# Patient Record
Sex: Male | Born: 1970 | Race: White | Hispanic: No | Marital: Married | Smoking: Never smoker
Health system: Southern US, Community
[De-identification: ages and names within clinical notes are randomized; demographics above are authoritative.]

## PROBLEM LIST (undated history)

## (undated) DIAGNOSIS — T7840XA Allergy, unspecified, initial encounter: Secondary | ICD-10-CM

## (undated) HISTORY — DX: Allergy, unspecified, initial encounter: T78.40XA

---

## 2012-01-28 ENCOUNTER — Telehealth: Payer: Self-pay

## 2012-01-28 NOTE — Telephone Encounter (Signed)
Please pull chart.  Todd Roach 

## 2012-01-28 NOTE — Telephone Encounter (Signed)
PT STATES HE IN NEED OF THE ANG GEL CREAM PLEASE CALL [phone]   [facility] [address]

## 2012-01-28 NOTE — Telephone Encounter (Signed)
Can we fill this?

## 2012-01-29 NOTE — Telephone Encounter (Signed)
Chart is at pa desk.

## 2012-01-30 MED ORDER — TESTOSTERONE 20.25 MG/1.25GM (1.62%) TD GEL: 4.0000 | Freq: Every day | TRANSDERMAL | Status: DC

## 2012-01-30 NOTE — Telephone Encounter (Signed)
Please advise patient that rx is ready (he may pick up or you may fax), however, he needs OV/labs for additional fills.

## 2012-01-30 NOTE — Telephone Encounter (Signed)
Called pt LMOM to CB. 

## 2012-01-31 ENCOUNTER — Telehealth: Payer: Self-pay

## 2012-01-31 MED ORDER — TESTOSTERONE 30 MG/ACT TD SOLN: 2.0000 | TRANSDERMAL | Status: DC

## 2012-01-31 NOTE — Telephone Encounter (Signed)
Changed to Axiron.

## 2012-01-31 NOTE — Telephone Encounter (Signed)
Faxed RX to [facility] on [address]

## 2012-01-31 NOTE — Telephone Encounter (Signed)
Faxed RX to pharmacy.  

## 2012-01-31 NOTE — Telephone Encounter (Signed)
Todd Roach pharmacy called and it looks like pt insurance will not pay for the androgel anymore. They will cover Androderm, Axiron, or Fortesta. Can we change to one of these?

## 2012-02-01 ENCOUNTER — Telehealth: Payer: Self-pay

## 2012-02-01 NOTE — Telephone Encounter (Signed)
RETURNING CALL FROM CLINICAL TL.  SHE IS CALLING FOR HER HUSBAND BUD.  NAME IS Nicolasa Ducking AT [phone]. NEEDS SOME INFORMATION FOR HER INSURANCE COMPANY RE: BUD'S RX FOR ANDROGEL.

## 2012-02-02 NOTE — Telephone Encounter (Signed)
LMOM to call back

## 2012-02-02 NOTE — Telephone Encounter (Signed)
I tried calling pt several times over the weekend. I spoke with his pharmacist and she stated HIS INSURANCE WILL NOT PAY for androgel but will pay for Androderm so I sent in a prescription for it on Sat. Please inform wife if she calls back.

## 2012-02-03 NOTE — Telephone Encounter (Signed)
LMOM to call back

## 2012-02-03 NOTE — Telephone Encounter (Signed)
Sent patient an unable to reach letter.

## 2012-03-03 ENCOUNTER — Encounter: Payer: Self-pay | Admitting: Family Medicine

## 2012-03-03 ENCOUNTER — Ambulatory Visit (INDEPENDENT_AMBULATORY_CARE_PROVIDER_SITE_OTHER): Payer: 59 | Admitting: Family Medicine

## 2012-03-03 VITALS — BP 130/80 | HR 91 | Temp 98.1°F | Resp 18 | Ht 69.5 in | Wt 347.8 lb

## 2012-03-03 DIAGNOSIS — Q870 Congenital malformation syndromes predominantly affecting facial appearance: Secondary | ICD-10-CM | POA: Insufficient documentation

## 2012-03-03 DIAGNOSIS — E291 Testicular hypofunction: Secondary | ICD-10-CM | POA: Insufficient documentation

## 2012-03-03 DIAGNOSIS — G527 Disorders of multiple cranial nerves: Secondary | ICD-10-CM

## 2012-03-03 DIAGNOSIS — E669 Obesity, unspecified: Secondary | ICD-10-CM

## 2012-03-03 LAB — COMPREHENSIVE METABOLIC PANEL
ALT: 45 U/L (ref 0–53)
AST: 35 U/L (ref 0–37)
Albumin: 4.3 g/dL (ref 3.5–5.2)
Alkaline Phosphatase: 49 U/L (ref 39–117)
BUN: 15 mg/dL (ref 6–23)
CO2: 28 mEq/L (ref 19–32)
Calcium: 9 mg/dL (ref 8.4–10.5)
Chloride: 103 mEq/L (ref 96–112)
Creat: 0.75 mg/dL (ref 0.50–1.35)
Glucose, Bld: 98 mg/dL (ref 70–99)
Potassium: 4.4 mEq/L (ref 3.5–5.3)
Sodium: 139 mEq/L (ref 135–145)
Total Bilirubin: 0.6 mg/dL (ref 0.3–1.2)
Total Protein: 6.9 g/dL (ref 6.0–8.3)

## 2012-03-03 LAB — CBC
HCT: 43.8 % (ref 39.0–52.0)
Hemoglobin: 14.8 g/dL (ref 13.0–17.0)
MCH: 29.5 pg (ref 26.0–34.0)
MCHC: 33.8 g/dL (ref 30.0–36.0)
MCV: 87.3 fL (ref 78.0–100.0)
Platelets: 150 10*3/uL (ref 150–400)
RBC: 5.02 MIL/uL (ref 4.22–5.81)
RDW: 14.1 % (ref 11.5–15.5)
WBC: 5.3 10*3/uL (ref 4.0–10.5)

## 2012-03-03 LAB — TSH: TSH: 2.211 u[IU]/mL (ref 0.350–4.500)

## 2012-03-03 LAB — LIPID PANEL
Cholesterol: 189 mg/dL (ref 0–200)
HDL: 34 mg/dL — ABNORMAL LOW (ref >39)
LDL Cholesterol: 116 mg/dL — ABNORMAL HIGH (ref 0–99)
Total CHOL/HDL Ratio: 5.6 Ratio
Triglycerides: 193 mg/dL — ABNORMAL HIGH (ref <150)
VLDL: 39 mg/dL (ref 0–40)

## 2012-03-03 LAB — TESTOSTERONE: Testosterone: 374.1 ng/dL (ref 300–890)

## 2012-03-03 LAB — PSA: PSA: 0.74 ng/mL (ref <=4.00)

## 2012-03-03 MED ORDER — TESTOSTERONE 30 MG/ACT TD SOLN: 2.0000 | TRANSDERMAL | Status: DC

## 2012-03-03 NOTE — Progress Notes (Signed)
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 He comes in today for check on his hypogonadism. He says he is doing much better with the current dose of Axiron.  Patient is embarking on a weight loss program with his wife. He is walking almost every day now and has started Weight Watchers.  Objective: Cheerful gentleman in no distress, obese  Dentition shows marked plaque buildup. Oropharynx otherwise clear  Neck: Supple, no adenopathy or thyromegaly  Chest: Clear  Heart: Regular no murmur  Extremities: No edema  Assessment: Stable regarding hypogonadism, needs to lose weight.  Plan: Continue Weight Watchers, continue axiron, check PSA, CBC, metabolic parameters, and testosterone  If all labs normal, follow up in 6 months  1. Obesity  CBC, Comprehensive metabolic panel, Lipid panel, TSH  2. Hypogonadism male  CBC, Testosterone 30 MG/ACT SOLN, PSA, Testosterone  3. Moebius syndrome    4. Obesity (BMI 35.0-39.9 without comorbidity)

## 2012-03-04 ENCOUNTER — Encounter: Payer: Self-pay | Admitting: Family Medicine

## 2012-04-26 ENCOUNTER — Other Ambulatory Visit: Payer: Self-pay | Admitting: Family Medicine

## 2012-04-28 ENCOUNTER — Telehealth: Payer: Self-pay

## 2012-04-28 MED ORDER — TESTOSTERONE 30 MG/ACT TD SOLN: 2.0000 | Freq: Every morning | TRANSDERMAL | Status: DC

## 2012-04-28 NOTE — Telephone Encounter (Signed)
PATIENT'S WIFE SAYS THE PHARMACY HAS SENT OVER A REQUEST FOR A REFILL ON AXIRON AND THEY HAVE NOT HEARD ANYTHING.  HE SEES DR. L, BUT HE IS REALLY NEEDING HIS MEDICINE.  PLEASE CALL [phone]

## 2012-04-28 NOTE — Telephone Encounter (Signed)
PUT IN BY ACCIDENT

## 2012-04-28 NOTE — Telephone Encounter (Signed)
Rx done and sent to pharmcy

## 2012-04-29 ENCOUNTER — Telehealth: Payer: Self-pay | Admitting: Family Medicine

## 2012-04-29 NOTE — Telephone Encounter (Signed)
Spoke with wife let her know RX was ready to pick up

## 2012-08-01 ENCOUNTER — Other Ambulatory Visit: Payer: Self-pay | Admitting: Physician Assistant

## 2012-08-01 NOTE — Telephone Encounter (Signed)
Pt needs office visit labs per May 2013 note

## 2012-09-16 ENCOUNTER — Ambulatory Visit (INDEPENDENT_AMBULATORY_CARE_PROVIDER_SITE_OTHER): Payer: 59 | Admitting: Family Medicine

## 2012-09-16 ENCOUNTER — Encounter: Payer: Self-pay | Admitting: Family Medicine

## 2012-09-16 VITALS — BP 132/90 | HR 93 | Temp 98.3°F | Resp 18 | Ht 69.5 in | Wt 347.0 lb

## 2012-09-16 DIAGNOSIS — E291 Testicular hypofunction: Secondary | ICD-10-CM

## 2012-09-16 DIAGNOSIS — Z23 Encounter for immunization: Secondary | ICD-10-CM

## 2012-09-16 LAB — CBC
HCT: 47.1 % (ref 39.0–52.0)
Hemoglobin: 16.6 g/dL (ref 13.0–17.0)
MCH: 30.7 pg (ref 26.0–34.0)
MCHC: 35.2 g/dL (ref 30.0–36.0)
MCV: 87.1 fL (ref 78.0–100.0)
Platelets: 127 10*3/uL — ABNORMAL LOW (ref 150–400)
RBC: 5.41 MIL/uL (ref 4.22–5.81)
RDW: 13.5 % (ref 11.5–15.5)
WBC: 5 10*3/uL (ref 4.0–10.5)

## 2012-09-16 LAB — COMPREHENSIVE METABOLIC PANEL
ALT: 42 U/L (ref 0–53)
AST: 29 U/L (ref 0–37)
Albumin: 4.5 g/dL (ref 3.5–5.2)
Alkaline Phosphatase: 53 U/L (ref 39–117)
BUN: 8 mg/dL (ref 6–23)
CO2: 24 mEq/L (ref 19–32)
Calcium: 8.7 mg/dL (ref 8.4–10.5)
Chloride: 104 mEq/L (ref 96–112)
Creat: 0.76 mg/dL (ref 0.50–1.35)
Glucose, Bld: 91 mg/dL (ref 70–99)
Potassium: 3.9 mEq/L (ref 3.5–5.3)
Sodium: 140 mEq/L (ref 135–145)
Total Bilirubin: 0.5 mg/dL (ref 0.3–1.2)
Total Protein: 7.3 g/dL (ref 6.0–8.3)

## 2012-09-16 LAB — LIPID PANEL
Cholesterol: 176 mg/dL (ref 0–200)
HDL: 36 mg/dL — ABNORMAL LOW (ref >39)
LDL Cholesterol: 107 mg/dL — ABNORMAL HIGH (ref 0–99)
Total CHOL/HDL Ratio: 4.9 Ratio
Triglycerides: 166 mg/dL — ABNORMAL HIGH (ref <150)
VLDL: 33 mg/dL (ref 0–40)

## 2012-09-16 LAB — TESTOSTERONE: Testosterone: 548.34 ng/dL (ref 300–890)

## 2012-09-16 LAB — TESTOSTERONE, FREE

## 2012-09-16 LAB — PSA: PSA: 0.56 ng/mL (ref <=4.00)

## 2012-09-16 NOTE — Progress Notes (Signed)
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 He's having no new symptoms referable to the testosterone. He's been using a daily and does feel like he has more libido and energy.  Objective: No acute distress, patient has gained significant weight Chest is clear Heart: Regular no murmur or gallop Abdomen: Soft nontender with no HSM  Assessment: Stable on current dose of Axiron Plan: Check labs today 1. Hypogonadism male  Testosterone, Testosterone, free, PSA, CBC, Lipid panel, Comprehensive metabolic panel

## 2012-09-19 LAB — TESTOSTERONE, FREE, TOTAL, SHBG
Sex Hormone Binding: 14 nmol/L (ref 13–71)
Testosterone, Free: 169.9 pg/mL (ref 47.0–244.0)
Testosterone-% Free: 3.1 % — ABNORMAL HIGH (ref 1.6–2.9)
Testosterone: 548.34 ng/dL (ref 300–890)

## 2012-10-01 ENCOUNTER — Other Ambulatory Visit: Payer: Self-pay | Admitting: Physician Assistant

## 2013-06-08 ENCOUNTER — Encounter: Payer: Self-pay | Admitting: Family Medicine

## 2013-06-08 ENCOUNTER — Ambulatory Visit (INDEPENDENT_AMBULATORY_CARE_PROVIDER_SITE_OTHER): Payer: 59 | Admitting: Family Medicine

## 2013-06-08 VITALS — BP 118/76 | HR 85 | Temp 97.9°F | Resp 18 | Ht 69.0 in | Wt 345.0 lb

## 2013-06-08 DIAGNOSIS — E291 Testicular hypofunction: Secondary | ICD-10-CM

## 2013-06-08 DIAGNOSIS — J309 Allergic rhinitis, unspecified: Secondary | ICD-10-CM

## 2013-06-08 NOTE — Progress Notes (Signed)
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 He's having no new symptoms referable to the testosterone. He stopped taking the testosterone because he saw adds on TV that suggested he might have a side effect.  Patient continues to have chronic sinus congestion. 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  Objective: No acute distress, obese Chest: Clear Heart: Regular Abdomen: Soft nontender without HSM HEENT: No facial movements, exophoria of the left eye Skin: Unremarkable   Allergic rhinitis - Plan: Ambulatory referral to Allergy, Comprehensive metabolic panel, CBC with Differential  Hypogonadism male - Plan: PSA, Testosterone, CANCELED: Comprehensive metabolic panel, CANCELED: PSA, CANCELED: CBC, CANCELED: Testosterone Encouraged to begin weight loss program including early cessation of bleeding in the evening, regular exercise.  Signed, [name], MD

## 2013-06-22 ENCOUNTER — Other Ambulatory Visit (INDEPENDENT_AMBULATORY_CARE_PROVIDER_SITE_OTHER): Payer: 59 | Admitting: *Deleted

## 2013-06-22 DIAGNOSIS — J309 Allergic rhinitis, unspecified: Secondary | ICD-10-CM

## 2013-06-22 DIAGNOSIS — E291 Testicular hypofunction: Secondary | ICD-10-CM

## 2013-06-22 LAB — COMPREHENSIVE METABOLIC PANEL
ALT: 56 U/L — ABNORMAL HIGH (ref 0–53)
AST: 29 U/L (ref 0–37)
Albumin: 4.4 g/dL (ref 3.5–5.2)
Alkaline Phosphatase: 67 U/L (ref 39–117)
BUN: 14 mg/dL (ref 6–23)
CO2: 29 mEq/L (ref 19–32)
Calcium: 9.2 mg/dL (ref 8.4–10.5)
Chloride: 101 mEq/L (ref 96–112)
Creat: 0.73 mg/dL (ref 0.50–1.35)
Glucose, Bld: 99 mg/dL (ref 70–99)
Potassium: 4.2 mEq/L (ref 3.5–5.3)
Sodium: 139 mEq/L (ref 135–145)
Total Bilirubin: 0.7 mg/dL (ref 0.3–1.2)
Total Protein: 7.2 g/dL (ref 6.0–8.3)

## 2013-06-22 LAB — PSA: PSA: 0.5 ng/mL (ref <=4.00)

## 2013-06-22 LAB — CBC WITH DIFFERENTIAL/PLATELET
Basophils Absolute: 0 10*3/uL (ref 0.0–0.1)
Basophils Relative: 0 % (ref 0–1)
Eosinophils Absolute: 0.1 10*3/uL (ref 0.0–0.7)
Eosinophils Relative: 2 % (ref 0–5)
HCT: 42.6 % (ref 39.0–52.0)
Hemoglobin: 14.7 g/dL (ref 13.0–17.0)
Lymphocytes Relative: 36 % (ref 12–46)
Lymphs Abs: 2.1 10*3/uL (ref 0.7–4.0)
MCH: 30.4 pg (ref 26.0–34.0)
MCHC: 34.5 g/dL (ref 30.0–36.0)
MCV: 88 fL (ref 78.0–100.0)
Monocytes Absolute: 0.6 10*3/uL (ref 0.1–1.0)
Monocytes Relative: 10 % (ref 3–12)
Neutro Abs: 3 10*3/uL (ref 1.7–7.7)
Neutrophils Relative %: 52 % (ref 43–77)
Platelets: 159 10*3/uL (ref 150–400)
RBC: 4.84 MIL/uL (ref 4.22–5.81)
RDW: 13.3 % (ref 11.5–15.5)
WBC: 5.8 10*3/uL (ref 4.0–10.5)

## 2013-06-22 NOTE — Progress Notes (Signed)
Patient here for labs only. 

## 2013-06-23 LAB — TESTOSTERONE: Testosterone: 133 ng/dL — ABNORMAL LOW (ref 300–890)

## 2014-01-18 ENCOUNTER — Ambulatory Visit (INDEPENDENT_AMBULATORY_CARE_PROVIDER_SITE_OTHER): Payer: No Typology Code available for payment source | Admitting: Family Medicine

## 2014-01-18 ENCOUNTER — Encounter: Payer: Self-pay | Admitting: Family Medicine

## 2014-01-18 ENCOUNTER — Other Ambulatory Visit: Payer: Self-pay | Admitting: Family Medicine

## 2014-01-18 VITALS — BP 122/68 | HR 84 | Temp 98.7°F | Resp 16 | Ht 69.0 in | Wt 356.8 lb

## 2014-01-18 DIAGNOSIS — R7309 Other abnormal glucose: Secondary | ICD-10-CM

## 2014-01-18 DIAGNOSIS — E291 Testicular hypofunction: Secondary | ICD-10-CM

## 2014-01-18 DIAGNOSIS — M779 Enthesopathy, unspecified: Secondary | ICD-10-CM

## 2014-01-18 DIAGNOSIS — G4700 Insomnia, unspecified: Secondary | ICD-10-CM

## 2014-01-18 LAB — COMPREHENSIVE METABOLIC PANEL
ALT: 52 U/L (ref 0–53)
AST: 30 U/L (ref 0–37)
Albumin: 4.3 g/dL (ref 3.5–5.2)
Alkaline Phosphatase: 59 U/L (ref 39–117)
BUN: 12 mg/dL (ref 6–23)
CO2: 29 mEq/L (ref 19–32)
Calcium: 9.3 mg/dL (ref 8.4–10.5)
Chloride: 101 mEq/L (ref 96–112)
Creat: 0.73 mg/dL (ref 0.50–1.35)
Glucose, Bld: 111 mg/dL — ABNORMAL HIGH (ref 70–99)
Potassium: 4.5 mEq/L (ref 3.5–5.3)
Sodium: 140 mEq/L (ref 135–145)
Total Bilirubin: 0.6 mg/dL (ref 0.2–1.2)
Total Protein: 7.1 g/dL (ref 6.0–8.3)

## 2014-01-18 LAB — TESTOSTERONE: Testosterone: 99 ng/dL — ABNORMAL LOW (ref 300–890)

## 2014-01-18 LAB — POCT GLYCOSYLATED HEMOGLOBIN (HGB A1C): Hemoglobin A1C: 5.1

## 2014-01-18 MED ORDER — LORAZEPAM 0.5 MG PO TABS: ORAL_TABLET | ORAL | Status: DC

## 2014-01-18 MED ORDER — MELOXICAM 7.5 MG PO TABS: 7.5000 mg | ORAL_TABLET | Freq: Every day | ORAL | Status: DC

## 2014-01-18 NOTE — Progress Notes (Signed)
Subjective:  This chart was scribed for Todd Sidle, MD by Carl Best, Medical Scribe. This patient was seen in Room 22 and the patient's care was started at 9:09 AM.   Patient ID: Todd Roach, male    DOB: 09/15/1971, 43 y.o.   MRN: [id number]  HPI HPI Comments: Todd Roach is a 43 y.o. male who presents to the [facility] complaining of weight gain and depression.  The patient's wife states that the patient has been taking St. John's Wort for a week and a half and has seen an improvement in his Roach.  The patient states that he is hesitant to take an SSRI because he is scared that it would hamper his creativity.  His wife states that he is also experiencing associated insomnia.  The patient is also complaining of seasonal allergies.  The patient's wife states that he is unable to close his eyes completely because of his Moebius syndrome which will cause him to experience allergic reactions.  She states that he takes Zyrtec and Allegra for his symptoms.  He is also complaining of intermittent anterior right hand pain when he extends his arm.  He is lists numbness as an associated symptom.  He states that he types a lot.    The patient is also complaining of large, cystic bumps on his testicles.     Past Medical History  Diagnosis Date   Allergy    No past surgical history on file. Family History  Problem Relation Age of Onset   Diabetes Father    History   Social History   Marital Status: Married    Spouse Name: N/A    Number of Children: N/A   Years of Education: N/A   Freight forwarder    Social History Main Topics   Smoking status: Never Smoker    Smokeless tobacco: Not on file   Alcohol Use: Yes   Drug Use: No   Sexual Activity: Not on file   Other Topics Concern   Not on file   Social History Narrative   No narrative on file   No Known Allergies  Review of Systems  Constitutional: Positive for unexpected  weight change (weight gain).  Psychiatric/Behavioral: Positive for sleep disturbance.  All other systems reviewed and are negative.   Objective:  Physical Exam  Nursing note and vitals reviewed. Constitutional: He is oriented to person, place, and time. He appears well-developed and well-nourished.  HENT:  Head: Normocephalic and atraumatic.  Eyes: EOM are normal.  Neck: Normal range of motion.  Cardiovascular: Normal rate.   Pulmonary/Chest: Effort normal.  Musculoskeletal: Normal range of motion.  Neurological: He is alert and oriented to person, place, and time.  Skin: Skin is warm and dry.  Psychiatric: He has a normal Roach and affect. His behavior is normal.   Examination of the genitalia reveals a few small sebaceous cyst on the scrotum  Results for orders placed in visit on 01/18/14  POCT GLYCOSYLATED HEMOGLOBIN (HGB A1C)      Result Value Ref Range   Hemoglobin A1C 5.1       BP 122/68   Pulse 84   Temp(Src) 98.7 F (37.1 C) (Oral)   Resp 16   Ht 5\' 9"  (1.753 m)   Wt 356 lb 12.8 oz (161.843 kg)   BMI 52.67 kg/m2   SpO2 97% Assessment & Plan:    I personally performed the services described in this documentation, which was scribed  in my presence. The recorded information has been reviewed and is accurate.  Hypogonadism male - Plan: Testosterone, Comprehensive metabolic panel  Tendonitis - Plan: meloxicam (MOBIC) 7.5 MG tablet  Insomnia - Plan: LORazepam (ATIVAN) 0.5 MG tablet  Other abnormal glucose - Plan: POCT glycosylated hemoglobin (Hb A1C)  Signed, Todd SidleKurt Lauenstein, MD

## 2014-01-18 NOTE — Patient Instructions (Signed)

## 2014-02-26 ENCOUNTER — Ambulatory Visit (INDEPENDENT_AMBULATORY_CARE_PROVIDER_SITE_OTHER): Payer: No Typology Code available for payment source | Admitting: Family Medicine

## 2014-02-26 ENCOUNTER — Encounter: Payer: Self-pay | Admitting: Family Medicine

## 2014-02-26 DIAGNOSIS — E291 Testicular hypofunction: Secondary | ICD-10-CM

## 2014-02-26 NOTE — Progress Notes (Signed)
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  Saw endocrinologist who has started testosterone 100  Mg IM q2 weeks.  He felt better 24 hours after first shot.  Objective:  NAD Wt Readings from Last 3 Encounters:  02/26/14 353 lb (160.12 kg)  01/18/14 356 lb 12.8 oz (161.843 kg)  06/08/13 345 lb (156.491 kg)  chest: cleaar Heart:  Reg Ext: no edema  We discussed weight, diet, extercise  Plan:  CPM Recheck one month

## 2014-03-01 ENCOUNTER — Other Ambulatory Visit: Payer: Self-pay | Admitting: Family Medicine

## 2014-03-02 NOTE — Telephone Encounter (Signed)
Dr L, do you want to give pt RFs for this?

## 2014-03-22 ENCOUNTER — Ambulatory Visit: Payer: No Typology Code available for payment source | Admitting: Family Medicine

## 2014-04-16 DIAGNOSIS — E291 Testicular hypofunction: Secondary | ICD-10-CM

## 2014-06-14 ENCOUNTER — Encounter: Payer: No Typology Code available for payment source | Admitting: Family Medicine

## 2014-07-16 ENCOUNTER — Encounter: Payer: No Typology Code available for payment source | Admitting: Family Medicine

## 2014-08-12 ENCOUNTER — Ambulatory Visit (INDEPENDENT_AMBULATORY_CARE_PROVIDER_SITE_OTHER): Payer: No Typology Code available for payment source | Admitting: Emergency Medicine

## 2014-08-12 VITALS — BP 138/90 | HR 90 | Temp 97.9°F | Resp 18 | Ht 73.5 in | Wt 364.0 lb

## 2014-08-12 DIAGNOSIS — L723 Sebaceous cyst: Secondary | ICD-10-CM

## 2014-08-12 MED ORDER — DOXYCYCLINE HYCLATE 100 MG PO TABS: 100.0000 mg | ORAL_TABLET | Freq: Two times a day (BID) | ORAL | Status: DC

## 2014-08-12 NOTE — Progress Notes (Signed)
Verbal Consent Obtained. Local anesthesia with 3 cc of 2% lidocaine plain.  11 blade used to trim necrotic tissue from the wound edges.  No purulence expressed. Packed with 1/4 inch plain packing.  Cleansed and dressed with gauze and paper tape.

## 2014-08-12 NOTE — Progress Notes (Signed)
   Subjective:    Patient ID: Todd Roach, male    DOB: 08/22/1971, 43 y.o.   MRN: [id number]  HPI Chief Complaint  Patient presents with  . Cyst    On patient's back. Ruptured last night. Slight bleeding this morning.    This chart was scribed for Lesle ChrisSteven Meagan Ancona, MD by Andrew Auaven Small, ED Scribe. This patient was seen in room 7 and the patient's care was started at 8:25 AM.  HPI Comments: Todd Roach is a 43 y.o. male who presents to the [facility] complaining of a recurrent cyst located on pt's back. Pt reports  similar cyst in 2012 which was removed by Dr. Milus GlazierLauenstein. Pt reports current cyst spontaneously ruptured last night consisting of blood and pus. Pt denies having a fever but reports he has been feeling off. After rupture pt treat cyst with Bactroban.    Past Medical History  Diagnosis Date  . Allergy    No Known Allergies Prior to Admission medications   Medication Sig Start Date End Date Taking? Authorizing Provider  cetirizine (ZYRTEC) 10 MG tablet Take 10 mg by mouth daily.   Yes Historical Provider, MD  Fexofenadine HCl (ALLEGRA PO) Take by mouth.   Yes Historical Provider, MD  LORazepam (ATIVAN) 0.5 MG tablet Prn hs 01/18/14  Yes Elvina SidleKurt Lauenstein, MD  meloxicam (MOBIC) 7.5 MG tablet TAKE 1 TABLET BY MOUTH ONCE DAILY   Yes Elvina SidleKurt Lauenstein, MD  Multiple Vitamin (MULTIVITAMIN) tablet Take 1 tablet by mouth daily.   Yes Historical Provider, MD  Omega-3 Fatty Acids (FISH OIL PO) Take by mouth daily.   Yes Historical Provider, MD  Testosterone 30 MG/ACT SOLN Place 2 Act onto the skin every morning. 03/03/12  Yes Elvina SidleKurt Lauenstein, MD  Testosterone 30 MG/ACT SOLN Place 2 Act onto the skin every morning. 04/28/12  Yes Heather M Marte, PA-C  [facility] J[facility] WORT PO Take by mouth daily.    Historical Provider, MD   Review of Systems  Constitutional: Negative for fever and chills.    Objective:   Physical Exam CONSTITUTIONAL: Well developed/well nourished HEAD:  Normocephalic/atraumatic EYES: EOMI/PERRL ENMT: Mucous membranes moist NECK: supple no meningeal signs SPINE:entire spine nontender CV: S1/S2 noted, no murmurs/rubs/gallops noted LUNGS: Lungs are clear to auscultation bilaterally, no apparent distress ABDOMEN: soft, nontender, no rebound or guarding GU:no cva tenderness NEURO: Pt is awake/alert, moves all extremitiesx4 EXTREMITIES: pulses normal, full ROM SKIN: warm, color normal,  Back- 1x3 cm a raised neurotic area above the right shoulder. Purulent sebaceous discharge present. There is an odor to discharge.  PSYCH: no abnormalities of mood noted       Assessment & Plan:  Patient has an infected sebaceous cyst on right upper back. This area will be drained. He will be on doxycycline twice a day. Recheck in 24-48 hours for packing change.I personally performed the services described in this documentation, which was scribed in my presence. The recorded information has been reviewed and is accurate.

## 2014-08-12 NOTE — Patient Instructions (Signed)
Take the antibiotic as prescribed. Apply a warm compress to the area for 15-20 minutes 2-4 times each day. Change the dressing if it becomes saturated, leaks or falls off.  

## 2014-08-14 ENCOUNTER — Ambulatory Visit (INDEPENDENT_AMBULATORY_CARE_PROVIDER_SITE_OTHER): Payer: No Typology Code available for payment source | Admitting: Physician Assistant

## 2014-08-14 VITALS — BP 144/84 | HR 78 | Temp 97.5°F | Resp 20 | Ht 69.0 in | Wt 359.2 lb

## 2014-08-14 DIAGNOSIS — L723 Sebaceous cyst: Secondary | ICD-10-CM

## 2014-08-15 LAB — WOUND CULTURE
GRAM STAIN: NONE SEEN
Gram Stain: NONE SEEN

## 2014-08-17 ENCOUNTER — Ambulatory Visit (INDEPENDENT_AMBULATORY_CARE_PROVIDER_SITE_OTHER): Payer: No Typology Code available for payment source | Admitting: Physician Assistant

## 2014-08-17 VITALS — BP 124/84 | HR 84 | Temp 98.3°F | Resp 18 | Ht 70.0 in | Wt 358.0 lb

## 2014-08-17 DIAGNOSIS — L723 Sebaceous cyst: Secondary | ICD-10-CM

## 2014-08-17 NOTE — Patient Instructions (Signed)
Return on 11/10 for wound recheck. Have your wife remove an inch of the packing on Sunday. Change your dressings daily

## 2014-08-17 NOTE — Progress Notes (Signed)
   Subjective:    Patient ID: Todd Roach, male    DOB: 07/12/1971, 43 y.o.   MRN: [id number]  HPI  Pt presents to clinic for a recheck.  He feels much better.  The area is draining bloody pus like discharge.  He is tolerating the abx ok.  Review of Systems     Objective:   Physical Exam  Constitutional: He is oriented to person, place, and time. He appears well-developed and well-nourished.  Pulmonary/Chest: Effort normal.  Neurological: He is alert and oriented to person, place, and time.  Skin: Skin is warm and dry.  Drsg and packing removed.  The area around the wound had minimal erythema - most of the erythema is related to drsg reaction.  Purulence expressed as well as sebaceous material.  The wound cavity extended really medially compared the incision so the incision was extended and some cyst wall was able to be removed.  The area was repacked and drsg was placed.   Psychiatric: He has a normal mood and affect. His behavior is normal. Judgment and thought content normal.  Vitals reviewed.      Assessment & Plan:

## 2014-08-17 NOTE — Progress Notes (Signed)
I was directly involved with the patient's care and agree with the physical, diagnosis and treatment plan.  

## 2014-08-17 NOTE — Progress Notes (Signed)
Subjective:    Patient ID: Todd Roach, male    DOB: 08/10/1971, 43 y.o.   MRN: [id number] Patient Active Problem List   Diagnosis Date Noted  . Moebius syndrome 03/03/2012  . Obesity (BMI 35.0-39.9 without comorbidity) 03/03/2012  . Hypogonadism male 03/03/2012   Prior to Admission medications   Medication Sig Start Date End Date Taking? Authorizing Provider  cetirizine (ZYRTEC) 10 MG tablet Take 10 mg by mouth daily.   Yes Historical Provider, MD  doxycycline (VIBRA-TABS) 100 MG tablet Take 1 tablet (100 mg total) by mouth 2 (two) times daily. 08/12/14  Yes Collene GobbleSteven A Daub, MD  Fexofenadine HCl (ALLEGRA PO) Take by mouth.   Yes Historical Provider, MD  LORazepam (ATIVAN) 0.5 MG tablet Prn hs 01/18/14  Yes Elvina SidleKurt Lauenstein, MD  meloxicam (MOBIC) 7.5 MG tablet TAKE 1 TABLET BY MOUTH ONCE DAILY   Yes Elvina SidleKurt Lauenstein, MD  Multiple Vitamin (MULTIVITAMIN) tablet Take 1 tablet by mouth daily.   Yes Historical Provider, MD  Omega-3 Fatty Acids (FISH OIL PO) Take by mouth daily.   Yes Historical Provider, MD  ST JOHNS WORT PO Take by mouth daily.   Yes Historical Provider, MD  testosterone cypionate (DEPOTESTOTERONE CYPIONATE) 200 MG/ML injection Inject 50 mg into the muscle. 06/14/14  Yes Historical Provider, MD   No Known Allergies  HPI  This is a 43 year old male presenting for 2nd recheck of an infected sebaceous cyst on his back. The area had spontaneously drained. The area was debrided and packed on 08/12/14. The opening of the wound was extended on 08/14/14 and was repacked. Today he is reporting he is without pain and feeling much better. He is still on doxycycline and tolerating well. He reports he is changing his dressing at least daily. He denies fever, chills, N/V/D.  Review of Systems  Constitutional: Negative for fever and chills.  Gastrointestinal: Negative for nausea, vomiting and diarrhea.  Skin: Positive for wound.      Objective:   Physical Exam  Constitutional: He is oriented  to person, place, and time. He appears well-developed and well-nourished. No distress.  HENT:  Head: Normocephalic and atraumatic.  Right Ear: Hearing normal.  Left Ear: Hearing normal.  Nose: Nose normal.  Eyes: Conjunctivae and lids are normal. Right eye exhibits no discharge. Left eye exhibits no discharge. No scleral icterus.  Pulmonary/Chest: Effort normal. No respiratory distress.  Neurological: He is alert and oriented to person, place, and time.  Skin: Skin is warm and dry. No rash noted.  Wound on back  Psychiatric: He has a normal mood and affect. His speech is normal and behavior is normal. Thought content normal.   BP 124/84 mmHg  Pulse 84  Temp(Src) 98.3 F (36.8 C) (Oral)  Resp 18  Ht 5\' 10"  (1.778 m)  Wt 358 lb (162.388 kg)  BMI 51.37 kg/m2  SpO2 98%   The dressing and packing were removed. Opening is 0.5 cm healing from a 1-1.5 cm incision with a total of 4-5 cm induration, no fluctuance. No purulence drained. Wound was irrigated with 5 cc 1% lido. Wound was loosely packed with 1/2 inch plain packing. Dressing placed and wound care discussed.    Assessment & Plan:  1. Sebaceous cyst Wound is healing nicely. No additional purulence expressed today. His comfort has improved. He will continue on doxycycline. He will continue to change his dressings daily. He was instructed to have his wife removed an inch of packing in 3 days and re-dress.  He will return for a wound check on 08/21/14.   Roswell MinersNicole V. Dyke BrackettBush, PA-C, MHS [facility]  08/17/2014

## 2014-08-20 ENCOUNTER — Encounter: Payer: No Typology Code available for payment source | Admitting: Family Medicine

## 2014-08-21 ENCOUNTER — Ambulatory Visit (INDEPENDENT_AMBULATORY_CARE_PROVIDER_SITE_OTHER): Payer: No Typology Code available for payment source | Admitting: Physician Assistant

## 2014-08-21 VITALS — BP 144/72 | HR 99 | Temp 99.0°F | Resp 18 | Ht 70.0 in | Wt 358.0 lb

## 2014-08-21 DIAGNOSIS — L723 Sebaceous cyst: Secondary | ICD-10-CM

## 2014-08-21 NOTE — Progress Notes (Signed)
   Subjective:    Patient ID: Todd Roach, male    DOB: 02/13/1971, 43 y.o.   MRN: [id number]  HPI Patient presents day 9 after I&D of sebaceous cyst on back. He is on last day of antibiotics and says has tolerated meds well. Denies pain, fever, N/V at this time.Wife has been changing dressing for him.   Review of Systems  Constitutional: Negative for fever and chills.  Gastrointestinal: Negative for nausea and vomiting.  Skin: Positive for wound. Negative for rash.       Dressing changed by wife       Objective:   Physical Exam  Constitutional: He is oriented to person, place, and time. He appears well-developed and well-nourished. No distress.  Blood pressure 144/72, pulse 99, temperature 99 F (37.2 C), temperature source Oral, resp. rate 18, height 5\' 10"  (1.778 m), weight 358 lb (162.388 kg), SpO2 96 %.   HENT:  Head: Normocephalic and atraumatic.  Right Ear: External ear normal.  Left Ear: External ear normal.  Pulmonary/Chest: Effort normal.  Neurological: He is alert and oriented to person, place, and time.  Skin: Skin is warm and dry. No rash noted. He is not diaphoretic. No erythema. No pallor.  Intact dressing and packing removed. Necrotic tissue at border of wound. Cloudy fluid and blood in wound. Minimal to no erythema. Tissue within wound pink.    Procedure Consent obtained. Wound irrigated with 5 cc 1% lido. Necrotic tissue removed. Cloudy, sanguinous fluid expressed. Wound expressed. Depth of 2 cm medially. Surface wound 1 1/2 cm. 1/4 plain packing placed. Clean dressing placed.      Assessment & Plan:  1. Sebaceous cyst Repacked and dressing placed. Wound healing well. Finish doxycycline. RTC 08/23/14 for re-eval.   Todd Ridgeishira Travone Georg PA-C  [facility] 08/21/2014 4:36 PM

## 2014-08-21 NOTE — Progress Notes (Signed)
I was directly involved with the patient's care and agree with the physical, diagnosis and treatment plan.  

## 2014-08-22 ENCOUNTER — Encounter: Payer: Self-pay | Admitting: Family Medicine

## 2014-08-22 ENCOUNTER — Ambulatory Visit (INDEPENDENT_AMBULATORY_CARE_PROVIDER_SITE_OTHER): Payer: No Typology Code available for payment source | Admitting: Family Medicine

## 2014-08-22 VITALS — BP 102/64 | HR 85 | Temp 98.0°F | Resp 16 | Ht 69.0 in | Wt 356.0 lb

## 2014-08-22 DIAGNOSIS — R202 Paresthesia of skin: Secondary | ICD-10-CM

## 2014-08-22 DIAGNOSIS — Z23 Encounter for immunization: Secondary | ICD-10-CM

## 2014-08-22 DIAGNOSIS — Z0000 Encounter for general adult medical examination without abnormal findings: Secondary | ICD-10-CM

## 2014-08-22 LAB — LIPID PANEL
Cholesterol: 196 mg/dL (ref 0–200)
HDL: 43 mg/dL (ref >39)
LDL Cholesterol: 118 mg/dL — ABNORMAL HIGH (ref 0–99)
Total CHOL/HDL Ratio: 4.6 Ratio
Triglycerides: 175 mg/dL — ABNORMAL HIGH (ref <150)
VLDL: 35 mg/dL (ref 0–40)

## 2014-08-22 LAB — COMPLETE METABOLIC PANEL WITH GFR
ALT: 34 U/L (ref 0–53)
AST: 23 U/L (ref 0–37)
Albumin: 4.4 g/dL (ref 3.5–5.2)
Alkaline Phosphatase: 65 U/L (ref 39–117)
BUN: 20 mg/dL (ref 6–23)
CO2: 29 mEq/L (ref 19–32)
Calcium: 9.7 mg/dL (ref 8.4–10.5)
Chloride: 99 mEq/L (ref 96–112)
Creat: 0.66 mg/dL (ref 0.50–1.35)
GFR, Est African American: >89 mL/min
GFR, Est Non African American: >89 mL/min
Glucose, Bld: 77 mg/dL (ref 70–99)
Potassium: 4.4 mEq/L (ref 3.5–5.3)
Sodium: 137 mEq/L (ref 135–145)
Total Bilirubin: 0.5 mg/dL (ref 0.2–1.2)
Total Protein: 7.5 g/dL (ref 6.0–8.3)

## 2014-08-22 LAB — POCT URINALYSIS DIPSTICK
Bilirubin, UA: NEGATIVE
Blood, UA: NEGATIVE
Glucose, UA: NEGATIVE
Ketones, UA: NEGATIVE
Leukocytes, UA: NEGATIVE
Nitrite, UA: NEGATIVE
Protein, UA: NEGATIVE
Spec Grav, UA: 1.015
Urobilinogen, UA: 0.2
pH, UA: 6.5

## 2014-08-22 LAB — CBC WITH DIFFERENTIAL/PLATELET
Basophils Absolute: 0 10*3/uL (ref 0.0–0.1)
Basophils Relative: 0 % (ref 0–1)
Eosinophils Absolute: 0.1 10*3/uL (ref 0.0–0.7)
Eosinophils Relative: 1 % (ref 0–5)
HCT: 47.4 % (ref 39.0–52.0)
Hemoglobin: 15.8 g/dL (ref 13.0–17.0)
Lymphocytes Relative: 26 % (ref 12–46)
Lymphs Abs: 2.1 10*3/uL (ref 0.7–4.0)
MCH: 29.1 pg (ref 26.0–34.0)
MCHC: 33.3 g/dL (ref 30.0–36.0)
MCV: 87.3 fL (ref 78.0–100.0)
Monocytes Absolute: 0.6 10*3/uL (ref 0.1–1.0)
Monocytes Relative: 7 % (ref 3–12)
Neutro Abs: 5.4 10*3/uL (ref 1.7–7.7)
Neutrophils Relative %: 66 % (ref 43–77)
Platelets: 164 10*3/uL (ref 150–400)
RBC: 5.43 MIL/uL (ref 4.22–5.81)
RDW: 13.3 % (ref 11.5–15.5)
WBC: 8.2 10*3/uL (ref 4.0–10.5)

## 2014-08-22 MED ORDER — DICLOFENAC SODIUM 75 MG PO TBEC: 75.0000 mg | DELAYED_RELEASE_TABLET | Freq: Two times a day (BID) | ORAL | Status: DC

## 2014-08-22 NOTE — Patient Instructions (Signed)
Two areas to focus:  Teeth and weight.   Health Maintenance A healthy lifestyle and preventative care can promote health and wellness.  Maintain regular health, dental, and eye exams.  Eat a healthy diet. Foods like vegetables, fruits, whole grains, low-fat dairy products, and lean protein foods contain the nutrients you need and are low in calories. Decrease your intake of foods high in solid fats, added sugars, and salt. Get information about a proper diet from your health care provider, if necessary.  Regular physical exercise is one of the most important things you can do for your health. Most adults should get at least 150 minutes of moderate-intensity exercise (any activity that increases your heart rate and causes you to sweat) each week. In addition, most adults need muscle-strengthening exercises on 2 or more days a week.   Maintain a healthy weight. The body mass index (BMI) is a screening tool to identify possible weight problems. It provides an estimate of body fat based on height and weight. Your health care provider can find your BMI and can help you achieve or maintain a healthy weight. For males 20 years and older:  A BMI below 18.5 is considered underweight.  A BMI of 18.5 to 24.9 is normal.  A BMI of 25 to 29.9 is considered overweight.  A BMI of 30 and above is considered obese.  Maintain normal blood lipids and cholesterol by exercising and minimizing your intake of saturated fat. Eat a balanced diet with plenty of fruits and vegetables. Blood tests for lipids and cholesterol should begin at age 320 and be repeated every 5 years. If your lipid or cholesterol levels are high, you are over age 43, or you are at high risk for heart disease, you may need your cholesterol levels checked more frequently.Ongoing high lipid and cholesterol levels should be treated with medicines if diet and exercise are not working.  If you smoke, find out from your health care provider how to  quit. If you do not use tobacco, do not start.  Lung cancer screening is recommended for adults aged 55-80 years who are at high risk for developing lung cancer because of a history of smoking. A yearly low-dose CT scan of the lungs is recommended for people who have at least a 30-pack-year history of smoking and are current smokers or have quit within the past 15 years. A pack year of smoking is smoking an average of 1 pack of cigarettes a day for 1 year (for example, a 30-pack-year history of smoking could mean smoking 1 pack a day for 30 years or 2 packs a day for 15 years). Yearly screening should continue until the smoker has stopped smoking for at least 15 years. Yearly screening should be stopped for people who develop a health problem that would prevent them from having lung cancer treatment.  If you choose to drink alcohol, do not have more than 2 drinks per day. One drink is considered to be 12 oz (360 mL) of beer, 5 oz (150 mL) of wine, or 1.5 oz (45 mL) of liquor.  Avoid the use of street drugs. Do not share needles with anyone. Ask for help if you need support or instructions about stopping the use of drugs.  High blood pressure causes heart disease and increases the risk of stroke. Blood pressure should be checked at least every 1-2 years. Ongoing high blood pressure should be treated with medicines if weight loss and exercise are not effective.  If you  are 45-7 years old, ask your health care provider if you should take aspirin to prevent heart disease.  Diabetes screening involves taking a blood sample to check your fasting blood sugar level. This should be done once every 3 years after age 58 if you are at a normal weight and without risk factors for diabetes. Testing should be considered at a younger age or be carried out more frequently if you are overweight and have at least 1 risk factor for diabetes.  Colorectal cancer can be detected and often prevented. Most routine colorectal  cancer screening begins at the age of 97 and continues through age 98. However, your health care provider may recommend screening at an earlier age if you have risk factors for colon cancer. On a yearly basis, your health care provider may provide home test kits to check for hidden blood in the stool. A small camera at the end of a tube may be used to directly examine the colon (sigmoidoscopy or colonoscopy) to detect the earliest forms of colorectal cancer. Talk to your health care provider about this at age 43 when routine screening begins. A direct exam of the colon should be repeated every 5-10 years through age 87, unless early forms of precancerous polyps or small growths are found.  People who are at an increased risk for hepatitis B should be screened for this virus. You are considered at high risk for hepatitis B if:  You were born in a country where hepatitis B occurs often. Talk with your health care provider about which countries are considered high risk.  Your parents were born in a high-risk country and you have not received a shot to protect against hepatitis B (hepatitis B vaccine).  You have HIV or AIDS.  You use needles to inject street drugs.  You live with, or have sex with, someone who has hepatitis B.  You are a man who has sex with other men (MSM).  You get hemodialysis treatment.  You take certain medicines for conditions like cancer, organ transplantation, and autoimmune conditions.  Hepatitis C blood testing is recommended for all people born from [birth year] through 1965 and any individual with known risk factors for hepatitis C.  Healthy men should no longer receive prostate-specific antigen (PSA) blood tests as part of routine cancer screening. Talk to your health care provider about prostate cancer screening.  Testicular cancer screening is not recommended for adolescents or adult males who have no symptoms. Screening includes self-exam, a health care provider exam, and  other screening tests. Consult with your health care provider about any symptoms you have or any concerns you have about testicular cancer.  Practice safe sex. Use condoms and avoid high-risk sexual practices to reduce the spread of sexually transmitted infections (STIs).  You should be screened for STIs, including gonorrhea and chlamydia if:  You are sexually active and are younger than 24 years.  You are older than 24 years, and your health care provider tells you that you are at risk for this type of infection.  Your sexual activity has changed since you were last screened, and you are at an increased risk for chlamydia or gonorrhea. Ask your health care provider if you are at risk.  If you are at risk of being infected with HIV, it is recommended that you take a prescription medicine daily to prevent HIV infection. This is called pre-exposure prophylaxis (PrEP). You are considered at risk if:  You are a man who has sex with  other men (MSM).  You are a heterosexual man who is sexually active with multiple partners.  You take drugs by injection.  You are sexually active with a partner who has HIV.  Talk with your health care provider about whether you are at high risk of being infected with HIV. If you choose to begin PrEP, you should first be tested for HIV. You should then be tested every 3 months for as long as you are taking PrEP.  Use sunscreen. Apply sunscreen liberally and repeatedly throughout the day. You should seek shade when your shadow is shorter than you. Protect yourself by wearing long sleeves, pants, a wide-brimmed hat, and sunglasses year round whenever you are outdoors.  Tell your health care provider of new moles or changes in moles, especially if there is a change in shape or color. Also, tell your health care provider if a mole is larger than the size of a pencil eraser.  A one-time screening for abdominal aortic aneurysm (AAA) and surgical repair of large AAAs by  ultrasound is recommended for men aged 65-75 years who are current or former smokers.  Stay current with your vaccines (immunizations). Document Released: 03/26/2008 Document Revised: 10/03/2013 Document Reviewed: 02/23/2011 [facility] Patient Information 2015 [place], [place]. This information is not intended to replace advice given to you by your health care provider. Make sure you discuss any questions you have with your health care provider.

## 2014-08-22 NOTE — Progress Notes (Addendum)
Patient ID: Todd Roach MRN: [id number], DOB: 04/28/1971 43 y.o. Date of Encounter: 08/22/2014, 2:21 PM  Primary Physician: No PCP Per Patient  Chief Complaint: Physical (CPE)  HPI: 43 y.o. y/o male with history noted below here for CPE.  Doing well. No issues/complaints.  Review of Systems: Consitutional: No fever, chills, fatigue, night sweats, lymphadenopathy, or weight changes. Eyes: No visual changes, eye redness, or discharge. ENT/Mouth: Ears: No otalgia, tinnitus, hearing loss, discharge. Nose: No congestion, rhinorrhea, sinus pain, or epistaxis. Throat: No sore throat, post nasal drip, or teeth pain. Cardiovascular: No CP, palpitations, diaphoresis, DOE, edema, orthopnea, PND. Respiratory: No cough, hemoptysis, SOB, or wheezing. Gastrointestinal: No anorexia, dysphagia, reflux, pain, nausea, vomiting, hematemesis, diarrhea, constipation, BRBPR, or melena. Genitourinary: No dysuria, frequency, urgency, hematuria, incontinence, nocturia, decreased urinary stream, discharge, impotence, or testicular pain/masses. Musculoskeletal: No decreased ROM, myalgias, stiffness, joint swelling, or weakness. Skin: No rash, erythema, lesion changes, pain, warmth, jaundice, or pruritis. Neurological: No headache, dizziness, syncope, seizures, tremors, memory loss, coordination problems, or paresthesias. Psychological: No anxiety, depression, hallucinations, SI/HI. Endocrine: No fatigue, polydipsia, polyphagia, polyuria, or known diabetes. All other systems were reviewed and are otherwise negative.  Past Medical History  Diagnosis Date  . Allergy      No past surgical history on file.  Home Meds:  Prior to Admission medications   Medication Sig Start Date End Date Taking? Authorizing Provider  cetirizine (ZYRTEC) 10 MG tablet Take 10 mg by mouth daily.    Historical Provider, MD  doxycycline (VIBRA-TABS) 100 MG tablet Take 1 tablet (100 mg total) by mouth 2 (two) times daily. 08/12/14    Collene GobbleSteven A Daub, MD  Fexofenadine HCl (ALLEGRA PO) Take by mouth.    Historical Provider, MD  LORazepam (ATIVAN) 0.5 MG tablet Prn hs 01/18/14   Elvina SidleKurt Dayvon Dax, MD  meloxicam (MOBIC) 7.5 MG tablet TAKE 1 TABLET BY MOUTH ONCE DAILY    Elvina SidleKurt Audon Heymann, MD  Multiple Vitamin (MULTIVITAMIN) tablet Take 1 tablet by mouth daily.    Historical Provider, MD  Omega-3 Fatty Acids (FISH OIL PO) Take by mouth daily.    Historical Provider, MD  ST JOHNS WORT PO Take by mouth daily.    Historical Provider, MD  testosterone cypionate (DEPOTESTOTERONE CYPIONATE) 200 MG/ML injection Inject 50 mg into the muscle. 06/14/14   Historical Provider, MD    Allergies: No Known Allergies  History   Social History  . Marital Status: Married    Spouse Name: N/A    Number of Children: N/A  . Years of Education: N/A   Occupational History  . Writer    Social History Main Topics  . Smoking status: Never Smoker   . Smokeless tobacco: Not on file  . Alcohol Use: Yes  . Drug Use: No  . Sexual Activity: Not on file   Other Topics Concern  . Not on file   Social History Narrative    Family History  Problem Relation Age of Onset  . Diabetes Father     Physical Exam: Resp. rate 16, height 5\' 9"  (1.753 m), weight 356 lb (161.481 kg).  BP Readings from Last 3 Encounters:  08/21/14 144/72  08/17/14 124/84  08/14/14 144/84   General: Well developed, well nourished, in no acute distress. HEENT: Normocephalic, atraumatic. Conjunctiva pink, sclera non-icteric. Pupils 2 mm constricting to 1 mm, round, regular, and equally reactive to light and accomodation. EOM shows left exotropia. Internal auditory canal clear. TMs with good cone of light and without pathology. Nasal mucosa  pink. Nares are without discharge. No sinus tenderness. Oral mucosa pink. Dentition poor. Pharynx without exudate.       Neck: Supple. Trachea midline. No thyromegaly. Full ROM. No lymphadenopathy. Lungs: Clear to auscultation bilaterally  without wheezes, rales, or rhonchi. Breathing is of normal effort and unlabored. Cardiovascular: RRR with S1 S2. No murmurs, rubs, or gallops appreciated. Distal pulses 2+ symmetrically. No carotid or abdominal bruits Abdomen: Soft, non-tender, non-distended with normoactive bowel sounds. No hepatosplenomegaly or masses. No rebound/guarding. No CVA tenderness. Without hernias.   Genitourinary:  circumcised male. No penile lesions. Testes descended bilaterally, and smooth without tenderness or masses.  Musculoskeletal: Full range of motion and 5/5 strength throughout. Without swelling, atrophy, tenderness, crepitus, or warmth. Extremities without clubbing, cyanosis, or edema. Calves supple. Skin: Warm and moist without erythema, ecchymosis, wounds, or rash.  His sebaceous cyst is clean and healing nicely on right scapula Neuro: A+Ox3. CN II-XII grossly intact with exception of immobile facial muscles and EOM as noted above. Moves all extremities spontaneously. Full sensation throughout. Normal gait. DTR 2+ throughout upper and lower extremities. Finger to nose intact. Psych:  Responds to questions appropriately with a normal affect.   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  Patient promises to work on these.  Signed, Elvina SidleKurt Khi Mcmillen, MD 08/22/2014 2:21 PM

## 2014-08-23 LAB — HEMOGLOBIN A1C
Hgb A1c MFr Bld: 5.5 % (ref <5.7)
Mean Plasma Glucose: 111 mg/dL (ref <117)

## 2014-08-27 ENCOUNTER — Encounter: Payer: No Typology Code available for payment source | Admitting: Family Medicine

## 2014-09-28 ENCOUNTER — Other Ambulatory Visit: Payer: Self-pay | Admitting: Family Medicine

## 2014-09-28 ENCOUNTER — Telehealth: Payer: Self-pay | Admitting: *Deleted

## 2014-09-28 NOTE — Addendum Note (Signed)
Addended by: Sheppard PlumberBRIGGS, Charvez Voorhies A on: 09/28/2014 12:22 PM   Modules accepted: Orders

## 2014-09-28 NOTE — Telephone Encounter (Signed)
Called pharmacy to cancel ativan prescription that sent by mistake.   Routing prescription to Doctor

## 2014-10-01 ENCOUNTER — Other Ambulatory Visit: Payer: Self-pay | Admitting: Family Medicine

## 2014-10-09 ENCOUNTER — Other Ambulatory Visit: Payer: Self-pay | Admitting: Family Medicine

## 2014-10-09 NOTE — Telephone Encounter (Signed)
I am out of town until next week.  Please refill x 5 months

## 2014-10-10 NOTE — Telephone Encounter (Signed)
Called in RFs per Dr Cain SaupeL's instr's.

## 2014-11-22 ENCOUNTER — Encounter: Payer: Self-pay | Admitting: Family Medicine

## 2014-11-22 DIAGNOSIS — E349 Endocrine disorder, unspecified: Secondary | ICD-10-CM | POA: Insufficient documentation

## 2014-12-10 ENCOUNTER — Other Ambulatory Visit: Payer: Self-pay | Admitting: Physician Assistant

## 2015-07-28 ENCOUNTER — Other Ambulatory Visit: Payer: Self-pay | Admitting: Family Medicine

## 2016-07-21 ENCOUNTER — Ambulatory Visit: Payer: No Typology Code available for payment source | Admitting: Family Medicine

## 2016-07-29 ENCOUNTER — Ambulatory Visit: Payer: No Typology Code available for payment source | Admitting: Family Medicine

## 2016-08-12 ENCOUNTER — Encounter: Payer: Self-pay | Admitting: Family Medicine

## 2016-08-12 ENCOUNTER — Ambulatory Visit (INDEPENDENT_AMBULATORY_CARE_PROVIDER_SITE_OTHER): Payer: BLUE CROSS/BLUE SHIELD | Admitting: Family Medicine

## 2016-08-12 VITALS — BP 122/80 | HR 93 | Temp 97.7°F | Resp 18 | Ht 69.0 in | Wt 370.0 lb

## 2016-08-12 DIAGNOSIS — L723 Sebaceous cyst: Secondary | ICD-10-CM | POA: Diagnosis not present

## 2016-08-12 DIAGNOSIS — Z13220 Encounter for screening for lipoid disorders: Secondary | ICD-10-CM

## 2016-08-12 DIAGNOSIS — Z114 Encounter for screening for human immunodeficiency virus [HIV]: Secondary | ICD-10-CM | POA: Diagnosis not present

## 2016-08-12 DIAGNOSIS — Z131 Encounter for screening for diabetes mellitus: Secondary | ICD-10-CM

## 2016-08-12 DIAGNOSIS — Q870 Congenital malformation syndromes predominantly affecting facial appearance: Secondary | ICD-10-CM

## 2016-08-12 DIAGNOSIS — E291 Testicular hypofunction: Secondary | ICD-10-CM | POA: Diagnosis not present

## 2016-08-12 DIAGNOSIS — L0591 Pilonidal cyst without abscess: Secondary | ICD-10-CM

## 2016-08-12 DIAGNOSIS — Z23 Encounter for immunization: Secondary | ICD-10-CM

## 2016-08-12 LAB — COMPREHENSIVE METABOLIC PANEL
ALBUMIN: 4.6 g/dL (ref 3.6–5.1)
ALK PHOS: 57 U/L (ref 40–115)
ALT: 63 U/L — AB (ref 9–46)
AST: 39 U/L (ref 10–40)
BILIRUBIN TOTAL: 0.5 mg/dL (ref 0.2–1.2)
BUN: 11 mg/dL (ref 7–25)
CALCIUM: 9.2 mg/dL (ref 8.6–10.3)
CO2: 27 mmol/L (ref 20–31)
Chloride: 102 mmol/L (ref 98–110)
Creat: 0.88 mg/dL (ref 0.60–1.35)
Glucose, Bld: 87 mg/dL (ref 65–99)
POTASSIUM: 4.2 mmol/L (ref 3.5–5.3)
Sodium: 140 mmol/L (ref 135–146)
TOTAL PROTEIN: 7.7 g/dL (ref 6.1–8.1)

## 2016-08-12 LAB — CBC WITH DIFFERENTIAL/PLATELET
Basophils Absolute: 0 cells/uL (ref 0–200)
Basophils Relative: 0 %
EOS ABS: 56 {cells}/uL (ref 15–500)
Eosinophils Relative: 1 %
HEMATOCRIT: 44.4 % (ref 38.5–50.0)
HEMOGLOBIN: 14.7 g/dL (ref 13.2–17.1)
LYMPHS ABS: 1904 {cells}/uL (ref 850–3900)
LYMPHS PCT: 34 %
MCH: 29.8 pg (ref 27.0–33.0)
MCHC: 33.1 g/dL (ref 32.0–36.0)
MCV: 89.9 fL (ref 80.0–100.0)
MONO ABS: 560 {cells}/uL (ref 200–950)
MPV: 10.2 fL (ref 7.5–12.5)
Monocytes Relative: 10 %
Neutro Abs: 3080 cells/uL (ref 1500–7800)
Neutrophils Relative %: 55 %
Platelets: 164 10*3/uL (ref 140–400)
RBC: 4.94 MIL/uL (ref 4.20–5.80)
RDW: 13.4 % (ref 11.0–15.0)
WBC: 5.6 10*3/uL (ref 3.8–10.8)

## 2016-08-12 LAB — LIPID PANEL
CHOL/HDL RATIO: 5.4 ratio — AB (ref <=5.0)
CHOLESTEROL: 214 mg/dL — AB (ref 125–200)
HDL: 40 mg/dL (ref >=40)
LDL Cholesterol: 141 mg/dL — ABNORMAL HIGH (ref <130)
Triglycerides: 167 mg/dL — ABNORMAL HIGH (ref <150)
VLDL: 33 mg/dL — ABNORMAL HIGH (ref <30)

## 2016-08-12 LAB — TSH: TSH: 2.35 m[IU]/L (ref 0.40–4.50)

## 2016-08-12 MED ORDER — DOXYCYCLINE HYCLATE 100 MG PO TABS: 100.0000 mg | ORAL_TABLET | Freq: Two times a day (BID) | ORAL | 0 refills | Status: DC

## 2016-08-12 NOTE — Progress Notes (Signed)
Subjective:    Patient ID: Todd Roach, male    DOB: 12-Jul-1971, 45 y.o.   MRN: [id number]  08/12/2016  BLOODWORK and Cyst (BOTTOM AREA  AND MIDDLE OF BACK)   HPI This 45 y.o. male presents for follow-up of the following:  Last physical: 2015   Immunization History  Administered Date(s) Administered  . Influenza, Seasonal, Injecte, Preservative Fre 09/16/2012  . Influenza,inj,Quad PF,36+ Mos 08/22/2014, 08/12/2016  . Tdap 08/12/2016   Desires screening labs.  Not ready for a physical today.  Wife and pt plan to start a weight loss program this week.    Cyst: present for a long time; active for one year intermittently.  Mildly swollen currently.  Burst a few days ago.  Drains a lot.    BP Readings from Last 3 Encounters:  08/12/16 122/80  08/22/14 102/64  08/21/14 (!) 144/72   Wt Readings from Last 3 Encounters:  08/12/16 (!) 370 lb (167.8 kg)  08/22/14 (!) 356 lb (161.5 kg)  08/21/14 (!) 358 lb (162.4 kg)    Review of Systems  Constitutional: Negative for activity change, appetite change, chills, diaphoresis, fatigue and fever.  Respiratory: Negative for cough and shortness of breath.   Cardiovascular: Negative for chest pain, palpitations and leg swelling.  Gastrointestinal: Negative for abdominal pain, diarrhea, nausea and vomiting.  Endocrine: Negative for cold intolerance, heat intolerance, polydipsia, polyphagia and polyuria.  Skin: Positive for wound. Negative for color change and rash.  Neurological: Negative for dizziness, tremors, seizures, syncope, facial asymmetry, speech difficulty, weakness, light-headedness, numbness and headaches.  Psychiatric/Behavioral: Negative for dysphoric mood and sleep disturbance. The patient is not nervous/anxious.     Past Medical History:  Diagnosis Date  . Allergy    History reviewed. No pertinent surgical history. No Known Allergies  Social History   Social History  . Marital status: Married    Spouse name: N/A  .  Number of children: N/A  . Years of education: N/A   Occupational History  . Writer    Social History Main Topics  . Smoking status: Never Smoker  . Smokeless tobacco: Never Used  . Alcohol use Yes  . Drug use: No  . Sexual activity: Not on file   Other Topics Concern  . Not on file   Social History Narrative  . No narrative on file   Family History  Problem Relation Age of Onset  . Diabetes Father        Objective:    BP 122/80 (BP Location: Left Arm, Patient Position: Sitting, Cuff Size: Large)   Pulse 93   Temp 97.7 F (36.5 C) (Oral)   Resp 18   Ht _0  (1.753 m)   Wt (!) 370 lb (167.8 kg)   SpO2 95%   BMI 54.64 kg/m  Physical Exam  Constitutional: He is oriented to person, place, and time. He appears well-developed and well-nourished. No distress.  HENT:  Head: Normocephalic and atraumatic.  Right Ear: External ear normal.  Left Ear: External ear normal.  Nose: Nose normal.  Mouth/Throat: Oropharynx is clear and moist.  Eyes: Conjunctivae and EOM are normal. Pupils are equal, round, and reactive to light.  Neck: Normal range of motion. Neck supple. Carotid bruit is not present. No thyromegaly present.  Cardiovascular: Normal rate, regular rhythm, normal heart sounds and intact distal pulses.  Exam reveals no gallop and no friction rub.   No murmur heard. Pulmonary/Chest: Effort normal and breath sounds normal. He has no wheezes. He has  no rales.  Abdominal: Soft. Bowel sounds are normal. He exhibits no distension and no mass. There is no tenderness. There is no rebound and no guarding.  Lymphadenopathy:    He has no cervical adenopathy.  Neurological: He is alert and oriented to person, place, and time. A cranial nerve deficit is present. Coordination normal.  Skin: Skin is warm and dry. No rash noted. He is not diaphoretic.     Healing abscess present buttocks region at gluteal ridge; minimal erythema; no active drainage; no fluctuance.  Psychiatric:  He has a normal mood and affect. His behavior is normal.  Nursing note and vitals reviewed.  Results for orders placed or performed in visit on 08/12/16  CBC with Differential/Platelet  Result Value Ref Range   WBC 5.6 3.8 - 10.8 K/uL   RBC 4.94 4.20 - 5.80 MIL/uL   Hemoglobin 14.7 13.2 - 17.1 g/dL   HCT 44.4 38.5 - 50.0 %   MCV 89.9 80.0 - 100.0 fL   MCH 29.8 27.0 - 33.0 pg   MCHC 33.1 32.0 - 36.0 g/dL   RDW 13.4 11.0 - 15.0 %   Platelets 164 140 - 400 K/uL   MPV 10.2 7.5 - 12.5 fL   Neutro Abs 3,080 1,500 - 7,800 cells/uL   Lymphs Abs 1,904 850 - 3,900 cells/uL   Monocytes Absolute 560 200 - 950 cells/uL   Eosinophils Absolute 56 15 - 500 cells/uL   Basophils Absolute 0 0 - 200 cells/uL   Neutrophils Relative % 55 %   Lymphocytes Relative 34 %   Monocytes Relative 10 %   Eosinophils Relative 1 %   Basophils Relative 0 %   Smear Review Criteria for review not met   Comprehensive metabolic panel  Result Value Ref Range   Sodium 140 135 - 146 mmol/L   Potassium 4.2 3.5 - 5.3 mmol/L   Chloride 102 98 - 110 mmol/L   CO2 27 20 - 31 mmol/L   Glucose, Bld 87 65 - 99 mg/dL   BUN 11 7 - 25 mg/dL   Creat 0.88 0.60 - 1.35 mg/dL   Total Bilirubin 0.5 0.2 - 1.2 mg/dL   Alkaline Phosphatase 57 40 - 115 U/L   AST 39 10 - 40 U/L   ALT 63 (H) 9 - 46 U/L   Total Protein 7.7 6.1 - 8.1 g/dL   Albumin 4.6 3.6 - 5.1 g/dL   Calcium 9.2 8.6 - 10.3 mg/dL  Lipid panel  Result Value Ref Range   Cholesterol 214 (H) 125 - 200 mg/dL   Triglycerides 167 (H) <150 mg/dL   HDL 40 >=40 mg/dL   Total CHOL/HDL Ratio 5.4 (H) <=5.0 Ratio   VLDL 33 (H) <30 mg/dL   LDL Cholesterol 141 (H) <130 mg/dL  HIV antibody  Result Value Ref Range   HIV 1&2 Ab, 4th Generation NONREACTIVE NONREACTIVE  Hemoglobin A1c  Result Value Ref Range   Hgb A1c MFr Bld 5.2 <5.7 %   Mean Plasma Glucose 103 mg/dL  TSH  Result Value Ref Range   TSH 2.35 0.40 - 4.50 mIU/L       Assessment & Plan:   1. Pilonidal cyst  without abscess   2. Sebaceous cyst   3. Need for prophylactic vaccination and inoculation against influenza   4. Screening for diabetes mellitus   5. Screening, lipid   6. Screening for HIV (human immunodeficiency virus)   7. 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 Obesity,  morbid ([place])   10. Need for Tdap vaccination    -rx for Doxycycline for pilonidal cyst that is minimal at this time; discussed the recurrent nature of pilonidal cysts; may warrant referral to general surgery if becomes a recurring issue. -s/p TDAP and flu vaccines. -obtain age appropriate labs. -recommend weight loss, exercise, and low-calorie food choices; recommend limiting daily caloric intake to 1800 kcal.    Orders Placed This Encounter  Procedures  . Flu Vaccine QUAD 36+ mos IM  . Tdap vaccine greater than or equal to 7yo IM  . CBC with Differential/Platelet  . Comprehensive metabolic panel    Order Specific Question:   Has the patient fasted?    Answer:   Yes  . Lipid panel    Order Specific Question:   Has the patient fasted?    Answer:   Yes  . HIV antibody  . Hemoglobin A1c  . TSH   Meds ordered this encounter  Medications  . Glucosamine 500 MG CAPS    Sig: Take by mouth.  . doxycycline (VIBRA-TABS) 100 MG tablet    Sig: Take 1 tablet (100 mg total) by mouth 2 (two) times daily.    Dispense:  20 tablet    Refill:  0    No Follow-up on file.   Keashia Haskins Elayne Guerin, M.D. [facility] [address] [place], [place]  [address] [phone] phone [phone] fax

## 2016-08-12 NOTE — Patient Instructions (Addendum)
IF you received an x-ray today, you will receive an [facility] from [facility]. Please contact [facility] at [phone] with questions or concerns regarding your invoice.   IF you received labwork today, you will receive an invoice from [facility]. Please contact [facility] at [phone] with questions or concerns regarding your invoice.   Our billing staff will not be able to assist you with questions regarding bills from these companies.  You will be contacted with the lab results as soon as they are available. The fastest way to get your results is to activate your My Chart account. Instructions are located on the last page of this paperwork. If you have not heard from us regarding the results in 2 weeks, please contact this office.    Pilonidal Cyst A pilonidal cyst is a fluid-filled sac. It forms beneath the skin near your tailbone, at the top of the crease of your buttocks. A pilonidal cyst that is not large or infected may not cause symptoms or problems. If the cyst becomes irritated or infected, it may fill with pus. This causes pain and swelling (pilonidal abscess). An infected cyst may need to be treated with medicine, drained, or removed. CAUSES The cause of a pilonidal cyst is not known. One cause may be a hair that grows into your skin (ingrown hair). RISK FACTORS Pilonidal cysts are more common in boys and men. Risk factors include:  Having lots of hair near the crease of the buttocks.  Being overweight.  Having a pilonidal dimple.  Wearing tight clothing.  Not bathing or showering frequently.  Sitting for long periods of time. SIGNS AND SYMPTOMS Signs and symptoms of a pilonidal cyst may include:  Redness.  Pain and tenderness.  Warmth.  Swelling.  Pus.  Fever. DIAGNOSIS Your health care provider may diagnose a pilonidal cyst based on your symptoms and a physical exam. The health care provider may do a  blood test to check for infection. If your cyst is draining pus, your health care provider may take a sample of the drainage to be tested at a laboratory. TREATMENT Surgery is the usual treatment for an infected pilonidal cyst. You may also have to take medicines before surgery. The type of surgery you have depends on the size and severity of the infected cyst. The different kinds of surgery include:  Incision and drainage. This is a procedure to open and drain the cyst.  Marsupialization. In this procedure, a large cyst or abscess may be opened and kept open by stitching the edges of the skin to the cyst walls.  Cyst removal. This procedure involves opening the skin and removing all or part of the cyst. HOME CARE INSTRUCTIONS  Follow all of your surgeon's instructions carefully if you had surgery.  Take medicines only as directed by your health care provider.  If you were prescribed an antibiotic medicine, finish it all even if you start to feel better.  Keep the area around your pilonidal cyst clean and dry.  Clean the area as directed by your health care provider. Pat the area dry with a clean towel. Do not rub it as this may cause bleeding.  Remove hair from the area around the cyst as directed by your health care provider.  Do not wear tight clothing or sit in one place for long periods of time.  There are many different ways to close and cover an incision, including stitches, skin glue, and adhesive strips. Follow your  health care provider's instructions on:  Incision care.  Bandage (dressing) changes and removal.  Incision closure removal. SEEK MEDICAL CARE IF:   You have drainage, redness, swelling, or pain at the site of the cyst.  You have a fever.   This information is not intended to replace advice given to you by your health care provider. Make sure you discuss any questions you have with your health care provider.   Document Released: 09/25/2000 Document Revised:  10/19/2014 Document Reviewed: 02/15/2014 Elsevier Interactive Patient Education [employer]2016 Elsevier Inc.

## 2016-08-13 LAB — HEMOGLOBIN A1C
Hgb A1c MFr Bld: 5.2 % (ref <5.7)
Mean Plasma Glucose: 103 mg/dL

## 2016-08-13 LAB — HIV ANTIBODY (ROUTINE TESTING W REFLEX): HIV: NONREACTIVE

## 2016-09-30 ENCOUNTER — Encounter: Payer: Self-pay | Admitting: Family Medicine

## 2016-09-30 ENCOUNTER — Ambulatory Visit (INDEPENDENT_AMBULATORY_CARE_PROVIDER_SITE_OTHER): Payer: BLUE CROSS/BLUE SHIELD | Admitting: Family Medicine

## 2016-09-30 VITALS — BP 108/82 | HR 82 | Temp 97.7°F | Resp 18 | Ht 69.0 in | Wt 367.0 lb

## 2016-09-30 DIAGNOSIS — R7989 Other specified abnormal findings of blood chemistry: Secondary | ICD-10-CM | POA: Diagnosis not present

## 2016-09-30 DIAGNOSIS — L989 Disorder of the skin and subcutaneous tissue, unspecified: Secondary | ICD-10-CM | POA: Diagnosis not present

## 2016-09-30 DIAGNOSIS — R945 Abnormal results of liver function studies: Secondary | ICD-10-CM

## 2016-09-30 DIAGNOSIS — L0591 Pilonidal cyst without abscess: Secondary | ICD-10-CM

## 2016-09-30 DIAGNOSIS — E7800 Pure hypercholesterolemia, unspecified: Secondary | ICD-10-CM | POA: Diagnosis not present

## 2016-09-30 MED ORDER — AMOXICILLIN-POT CLAVULANATE 875-125 MG PO TABS: 1.0000 | ORAL_TABLET | Freq: Two times a day (BID) | ORAL | 0 refills | Status: DC

## 2016-09-30 NOTE — Progress Notes (Signed)
Subjective:    Patient ID: Todd Roach, male    DOB: 08/21/1971, 45 y.o.   MRN: [id number]  09/30/2016  Follow-up (cyst)   HPI This 45 y.o. male presents for evaluation of pilonidal cyst.  Took course of Doxycycline; felt weird on Doxycycyline. A few days ago, having leakage of cyst.  Now scabbed over.  Painful to sit.  Still painful.  Discomfort.  Drainage has resolved.  No fever.  More fatigue past three days.  Has never required I&D of cyst.  Elevated LFTs: drinks 0-14 drinks per week; red wine.  Hypercholesterolemia:  Detected at last visit; working on diet, exercise, and weight loss with wife.  Immunization History  Administered Date(s) Administered  . Influenza, Seasonal, Injecte, Preservative Fre 09/16/2012  . Influenza,inj,Quad PF,36+ Mos 08/22/2014, 08/12/2016  . Tdap 08/12/2016    Review of Systems  Constitutional: Negative for activity change, appetite change, chills, diaphoresis, fatigue and fever.  Respiratory: Negative for cough and shortness of breath.   Cardiovascular: Negative for chest pain, palpitations and leg swelling.  Gastrointestinal: Negative for abdominal pain, diarrhea, nausea and vomiting.  Endocrine: Negative for cold intolerance, heat intolerance, polydipsia, polyphagia and polyuria.  Skin: Positive for wound. Negative for color change and rash.  Neurological: Negative for dizziness, tremors, seizures, syncope, facial asymmetry, speech difficulty, weakness, light-headedness, numbness and headaches.  Psychiatric/Behavioral: Negative for dysphoric mood and sleep disturbance. The patient is not nervous/anxious.     Past Medical History:  Diagnosis Date  . Allergy    History reviewed. No pertinent surgical history. No Known Allergies  Social History   Social History  . Marital status: Married    Spouse name: N/A  . Number of children: N/A  . Years of education: N/A   Occupational History  . Writer    Social History Main Topics  . Smoking  status: Never Smoker  . Smokeless tobacco: Never Used  . Alcohol use Yes  . Drug use: No  . Sexual activity: Not on file   Other Topics Concern  . Not on file   Social History Narrative  . No narrative on file   Family History  Problem Relation Age of Onset  . Diabetes Father        Objective:    BP 108/82 (BP Location: Left Arm, Patient Position: Sitting, Cuff Size: Large)   Pulse 82   Temp 97.7 F (36.5 C) (Oral)   Resp 18   Ht 5\' 9"  (1.753 m)   Wt (!) 367 lb (166.5 kg)   SpO2 96%   BMI 54.20 kg/m  Physical Exam  Constitutional: He is oriented to person, place, and time. He appears well-developed and well-nourished. No distress.  HENT:  Head: Normocephalic and atraumatic.  Right Ear: External ear normal.  Left Ear: External ear normal.  Nose: Nose normal.  Mouth/Throat: Oropharynx is clear and moist.  Eyes: Conjunctivae and EOM are normal. Pupils are equal, round, and reactive to light.  Neck: Normal range of motion. Neck supple. Carotid bruit is not present. No thyromegaly present.  Cardiovascular: Normal rate, regular rhythm, normal heart sounds and intact distal pulses.  Exam reveals no gallop and no friction rub.   No murmur heard. Pulmonary/Chest: Effort normal and breath sounds normal. He has no wheezes. He has no rales.  Abdominal: Soft. Bowel sounds are normal. He exhibits no distension and no mass. There is no tenderness. There is no rebound and no guarding.  Genitourinary:  Genitourinary Comments: Superior aspect of gluteal fold with  follicular prominence/pustule with minimal erythema and induration 1 cm; no fluctuance; no tenderness; no drainage.    Lymphadenopathy:    He has no cervical adenopathy.  Neurological: He is alert and oriented to person, place, and time. No cranial nerve deficit.  Skin: Skin is warm and dry. No rash noted. He is not diaphoretic.  Psychiatric: He has a normal mood and affect. His behavior is normal.  Nursing note and vitals  reviewed.  Results for orders placed or performed in visit on 09/30/16  Comprehensive metabolic panel  Result Value Ref Range   Glucose 94 65 - 99 mg/dL   BUN 14 6 - 24 mg/dL   Creatinine, Ser 9.600.65 (L) 0.76 - 1.27 mg/dL   GFR calc non Af Amer 117 >59 mL/min/1.73   GFR calc Af Amer 136 >59 mL/min/1.73   BUN/Creatinine Ratio 22 (H) 9 - 20   Sodium 141 134 - 144 mmol/L   Potassium 4.8 3.5 - 5.2 mmol/L   Chloride 101 96 - 106 mmol/L   CO2 29 18 - 29 mmol/L   Calcium 9.3 8.7 - 10.2 mg/dL   Total Protein 7.2 6.0 - 8.5 g/dL   Albumin 4.5 3.5 - 5.5 g/dL   Globulin, Total 2.7 1.5 - 4.5 g/dL   Albumin/Globulin Ratio 1.7 1.2 - 2.2   Bilirubin Total 0.3 0.0 - 1.2 mg/dL   Alkaline Phosphatase 72 39 - 117 IU/L   AST 34 0 - 40 IU/L   ALT 64 (H) 0 - 44 IU/L  Pathology Report  Result Value Ref Range   PATH REPORT.SITE OF ORIGIN SPEC Comment    . Comment    PATH REPORT.RELEVANT HX SPEC Comment    PATH REPORT.FINAL DX SPEC Comment    SIGNED OUT BY: Comment    GROSS DESCRIPTION: Comment    . Comment    PAYMENT PROCEDURE Comment    [facility] 09/30/2016 08/12/2016 08/22/2014 01/18/2014  Decreased Interest 0 0 0 0  Down, Depressed, Hopeless 0 0 0 0  PHQ - 2 Score 0 0 0 0       Assessment & Plan:   1. Pure hypercholesterolemia   2. Elevated LFTs   3. Skin lesion of back   4. Pilonidal cyst    -recommend weight loss, exercise, low-cholesterol food choices. -recommend decreased alcohol intake; repeat LFTs; avoid tylenol containing products; if persists, obtain abdominal us.  -pilonidal cyst with minimal inflammation; topical antibiotic cream bid for one week; rx for Augmentin provided if worsens; no indication for abx or surgical intervention at this time. -skin lesion on upper back: apply topical abx cream to area.   Orders Placed This Encounter  Procedures  . Comprehensive metabolic panel   Meds ordered this encounter  Medications  . amoxicillin-clavulanate  (AUGMENTIN) 875-125 MG tablet    Sig: Take 1 tablet by mouth 2 (two) times daily.    Dispense:  20 tablet    Refill:  0    No Follow-up on file.   Drayson Dorko Paulita FujitaMartin Karolina Zamor, M.D. [facility]  [facility] [address] [place], [place]  [id number] [phone] phone [phone] fax

## 2016-09-30 NOTE — Patient Instructions (Signed)

## 2016-10-01 LAB — COMPREHENSIVE METABOLIC PANEL
A/G RATIO: 1.7 (ref 1.2–2.2)
ALT: 64 IU/L — AB (ref 0–44)
AST: 34 IU/L (ref 0–40)
Albumin: 4.5 g/dL (ref 3.5–5.5)
Alkaline Phosphatase: 72 IU/L (ref 39–117)
BUN/Creatinine Ratio: 22 — ABNORMAL HIGH (ref 9–20)
BUN: 14 mg/dL (ref 6–24)
Bilirubin Total: 0.3 mg/dL (ref 0.0–1.2)
CALCIUM: 9.3 mg/dL (ref 8.7–10.2)
CO2: 29 mmol/L (ref 18–29)
Chloride: 101 mmol/L (ref 96–106)
Creatinine, Ser: 0.65 mg/dL — ABNORMAL LOW (ref 0.76–1.27)
GFR calc Af Amer: 136 mL/min/{1.73_m2} (ref >59)
GFR, EST NON AFRICAN AMERICAN: 117 mL/min/{1.73_m2} (ref >59)
GLUCOSE: 94 mg/dL (ref 65–99)
Globulin, Total: 2.7 g/dL (ref 1.5–4.5)
POTASSIUM: 4.8 mmol/L (ref 3.5–5.2)
Sodium: 141 mmol/L (ref 134–144)
Total Protein: 7.2 g/dL (ref 6.0–8.5)

## 2016-10-04 LAB — PATHOLOGY

## 2016-10-12 DIAGNOSIS — E7800 Pure hypercholesterolemia, unspecified: Secondary | ICD-10-CM | POA: Insufficient documentation

## 2016-12-30 ENCOUNTER — Encounter: Payer: Self-pay | Admitting: Family Medicine

## 2017-01-27 ENCOUNTER — Ambulatory Visit (INDEPENDENT_AMBULATORY_CARE_PROVIDER_SITE_OTHER): Payer: BLUE CROSS/BLUE SHIELD | Admitting: Family Medicine

## 2017-01-27 ENCOUNTER — Encounter: Payer: Self-pay | Admitting: Family Medicine

## 2017-01-27 VITALS — BP 116/77 | HR 92 | Temp 97.8°F | Resp 18 | Ht 69.0 in | Wt 368.0 lb

## 2017-01-27 DIAGNOSIS — M25511 Pain in right shoulder: Secondary | ICD-10-CM

## 2017-01-27 DIAGNOSIS — R945 Abnormal results of liver function studies: Secondary | ICD-10-CM

## 2017-01-27 DIAGNOSIS — J301 Allergic rhinitis due to pollen: Secondary | ICD-10-CM | POA: Diagnosis not present

## 2017-01-27 DIAGNOSIS — Q870 Congenital malformation syndromes predominantly affecting facial appearance: Secondary | ICD-10-CM

## 2017-01-27 DIAGNOSIS — L0591 Pilonidal cyst without abscess: Secondary | ICD-10-CM

## 2017-01-27 DIAGNOSIS — M25561 Pain in right knee: Secondary | ICD-10-CM

## 2017-01-27 DIAGNOSIS — R7989 Other specified abnormal findings of blood chemistry: Secondary | ICD-10-CM | POA: Diagnosis not present

## 2017-01-27 MED ORDER — AZELASTINE HCL 0.1 % NA SOLN: 2.0000 | Freq: Two times a day (BID) | NASAL | 12 refills | Status: DC

## 2017-01-27 MED ORDER — FLUTICASONE PROPIONATE 50 MCG/ACT NA SUSP: 2.0000 | Freq: Every day | NASAL | 6 refills | Status: AC

## 2017-01-27 NOTE — Patient Instructions (Addendum)
IF you received an x-ray today, you will receive an invoice from [facility]. Please contact [facility] at [phone] with questions or concerns regarding your invoice.   IF you received labwork today, you will receive an invoice from [facility]. Please contact [facility] at [phone] with questions or concerns regarding your invoice.   Our billing staff will not be able to assist you with questions regarding bills from these companies.  You will be contacted with the lab results as soon as they are available. The fastest way to get your results is to activate your My Chart account. Instructions are located on the last page of this paperwork. If you have not heard from [place] regarding the results in 2 weeks, please contact this office.      Subscapularis Tendon Injury Rehab Ask your health care provider which exercises are safe for you. Do exercises exactly as told by your health care provider and adjust them as directed. It is normal to feel mild stretching, pulling, tightness, or discomfort as you do these exercises, but you should stop right away if you feel sudden pain or your pain gets worse.Do not begin these exercises until told by your health care provider. Stretching and range of motion exercises These exercises warm up your muscles and joints and improve the movement and flexibility of your shoulder. These exercises also help to relieve pain, numbness, and tingling. Exercise A: Pendulum   1. Stand near a wall or a surface that you can hold onto for balance. 2. Bend at the waist and let your left / right arm hang straight down. Use your other arm to keep your balance. 3. Relax your arm and shoulder muscles, and move your hips and your trunk so your left / right arm swings freely. Your arm should swing because of the motion of your body, not because you are using your arm or shoulder muscles. 4. Keep moving so your arm swings in the following directions, as told  by your health care provider:  Side to side.  Forward and backward.  In clockwise and counterclockwise circles. Repeat __________ times, or for __________ seconds per direction. Complete this exercise __________ times a day. Exercise B: Flexion, seated   1. Sit in a stable chair so your left / right forearm can rest on a flat surface. Your elbow should rest at a height that keeps your upper arm next to your body. 2. Keeping your left / right shoulder relaxed, lean forward at the waist and slide your hand forward until you feel a stretch in your shoulder. You can move your chair farther back to increase the stretch, if needed. 3. Hold for __________ seconds. 4. Slowly return to the starting position. Repeat __________ times. Complete this exercise __________ times a day. Strengthening exercises These exercises build strength and endurance in your shoulder. Endurance is the ability to use your muscles for a long time, even after they get tired. Exercise C: Shoulder extension, prone   1. Lie on your abdomen on a firm surface so your left / right arm hangs over the edge. 2. Hold a __________ weight in your left / right hand so your palm faces in toward your body. Your arm should be straight. 3. Squeeze your shoulder blade down toward the middle of your back. 4. Slowly raise your arm behind you, up to the height of the surface that you are lying on. Keep your arm straight. 5. Hold for __________ seconds. 6. Slowly return to  the starting position and relax your muscles. Repeat __________ times. Complete this exercise __________ times a day. Exercise D: Internal rotation, isometric   1. Stand or sit in a doorway, facing the door frame. 2. Bend your left / right elbow and place the inside of your wrist against the door frame. Only your wrist should be touching the frame. Keep your upper arm at your side. 3. Gently press your wrist against the door frame, as if you are trying to push your arm  toward your abdomen. 4. Avoid shrugging your shoulder while you press your wrist into the door frame. Keep your shoulder blade tucked down toward the middle of your back. 5. Hold for __________ seconds. 6. Slowly release the tension, and relax your muscles completely before you repeat the exercise. Repeat __________ times. Complete this exercise __________ times a day. Exercise E: External rotation  1. Lie down on your left / right side. 2. Place a small pillow or rolled-up towel under your left / right upper arm. 3. Bend your left / right elbow to an "L" shape (90 degrees) so your hand is palm-down on your abdomen. 4. Squeeze your shoulder blade back toward your spine. 5. Keeping your upper arm against the pillow or towel:  Move (pivot) your forearm and your hand away from your abdomen and toward the ceiling.  Keep your elbow bent at 90 degrees. 6. Hold for __________ seconds. 7. Slowly return to the starting position. Repeat __________ times. Complete this exercise __________ times a day. Exercise F: Scapular retraction   1. Sit in a stable chair without arm rests, or stand. 2. Secure an exercise band to a stable object in front of you so the band is at shoulder height. 3. Hold one end of the exercise band in each hand. 4. Squeeze your shoulder blades together and move your elbows slightly behind you. Do not shrug your shoulders while you do this. 5. Hold for __________ seconds. 6. Slowly return to the starting position. Repeat __________ times. Complete this exercise __________ times a day. This information is not intended to replace advice given to you by your health care provider. Make sure you discuss any questions you have with your health care provider. Document Released: 09/28/2005 Document Revised: 06/04/2016 Document Reviewed: 09/01/2015 Elsevier Interactive Patient Education  2017 Elsevier Inc.  Iliotibial Band Syndrome Rehab Ask your health care provider which exercises  are safe for you. Do exercises exactly as told by your health care provider and adjust them as directed. It is normal to feel mild stretching, pulling, tightness, or discomfort as you do these exercises, but you should stop right away if you feel sudden pain or your pain gets worse.Do not begin these exercises until told by your health care provider. Stretching and range of motion exercises These exercises warm up your muscles and joints and improve the movement and flexibility of your hip and pelvis. Exercise A: Quadriceps, prone   1. Lie on your abdomen on a firm surface, such as a bed or padded floor. 2. Bend your left / right knee and hold your ankle. If you cannot reach your ankle or pant leg, loop a belt around your foot and grab the belt instead. 3. Gently pull your heel toward your buttocks. Your knee should not slide out to the side. You should feel a stretch in the front of your thigh and knee. 4. Hold this position for __________ seconds. Repeat __________ times. Complete this stretch __________ times a day. Exercise  B: Iliotibial band   5. Lie on your side with your left / right leg in the top position. 6. Bend both of your knees and grab your left / right ankle. Stretch out your bottom arm to help you balance. 7. Slowly bring your top knee back so your thigh goes behind your trunk. 8. Slowly lower your top leg toward the floor until you feel a gentle stretch on the outside of your left / right hip and thigh. If you do not feel a stretch and your knee will not fall farther, place the heel of your other foot on top of your knee and pull your knee down toward the floor with your foot. 9. Hold this position for __________ seconds. Repeat __________ times. Complete this stretch __________ times a day. Strengthening exercises These exercises build strength and endurance in your hip and pelvis. Endurance is the ability to use your muscles for a long time, even after they get  tired. Exercise C: Straight leg raises (  hip abductors) 7. Lie on your side with your left / right leg in the top position. Lie so your head, shoulder, knee, and hip line up. You may bend your bottom knee to help you balance. 8. Roll your hips slightly forward so your hips are stacked directly over each other and your left / right knee is facing forward. 9. Tense the muscles in your outer thigh and lift your top leg 4-6 inches (10-15 cm). 10. Hold this position for __________ seconds. 11. Slowly return to the starting position. Let your muscles relax completely before doing another repetition. Repeat __________ times. Complete this exercise __________ times a day. Exercise D: Straight leg raises ( hip extensors) 7. Lie on your abdomen on your bed or a firm surface. You can put a pillow under your hips if that is more comfortable. 8. Bend your left / right knee so your foot is straight up in the air. 9. Squeeze your buttock muscles and lift your left / right thigh off the bed. Do not let your back arch. 10. Tense this muscle as hard as you can without increasing any knee pain. 11. Hold this position for __________ seconds. 12. Slowly lower your leg to the starting position and allow it to relax completely. Repeat __________ times. Complete this exercise __________ times a day. Exercise E: Hip hike 1. Stand sideways on a bottom step. Stand on your left / right leg with your other foot unsupported next to the step. You can hold onto the railing or wall if needed for balance. 2. Keep your knees straight and your torso square. Then, lift your left / right hip up toward the ceiling. 3. Slowly let your left / right hip lower toward the floor, past the starting position. Your foot should get closer to the floor. Do not lean or bend your knees. Repeat __________ times. Complete this exercise __________ times a day. This information is not intended to replace advice given to you by your health care  provider. Make sure you discuss any questions you have with your health care provider. Document Released: 09/28/2005 Document Revised: 06/02/2016 Document Reviewed: 08/30/2015 Elsevier Interactive Patient Education  2017 [employer].

## 2017-01-27 NOTE — Progress Notes (Signed)
Subjective:    Patient ID: Todd Roach, male    DOB: 1971/02/20, 46 y.o.   MRN: [id number]  01/27/2017  Follow-up; Cyst (still has swelling); Referral (needs an referral to an surgeon due to cyst); and New Rx (patient is having allergies and would like to have some Flonase)   HPI This 46 y.o. male presents for follow-up of multiple concerns today:   Pilonidal cyst: not improving.  Symptoms wax and wane.  No fever/chills/sweats.  Recently increased in size and has now improved.  Pt is frustrated by persistent cyst and desires general surgery consultation to remove the cyst.    Allergies: have acutely worsened in the past several weeks.  Denies fever/chills/sweats.  Denies headaches or ear pain or sore throat.  +rhinorrhea; +nasal congestion ;+sneezing.  +PND.  +intermittent cough.  Desires rx.  Elevated LFTs; due for repeat LFTs; drinks on weekends; no tylenol products.    Obesity: has not successfully lost weight as planned after last visit.  R shoulder: onset of pain in past several weeks due to prolonged working no associated neck pain; good range of motion.  Denies n/t/w.  R knee pain:  No injury; seems to hurt when sitting in chair working.  Has a malfunctioning chair at home; no swelling; no giving out; no popping.  No associated lower back pain.    Review of Systems  Constitutional: Negative for activity change, appetite change, chills, diaphoresis, fatigue and fever.  HENT: Positive for congestion, postnasal drip, rhinorrhea and sneezing. Negative for ear pain, sinus pain, sinus pressure, sore throat, trouble swallowing and voice change.   Eyes: Negative for visual disturbance.  Respiratory: Positive for cough. Negative for shortness of breath and wheezing.   Cardiovascular: Negative for chest pain, palpitations and leg swelling.  Gastrointestinal: Negative for abdominal distention, abdominal pain, anal bleeding, blood in stool, constipation, diarrhea, nausea, rectal pain and  vomiting.  Endocrine: Negative for cold intolerance, heat intolerance, polydipsia, polyphagia and polyuria.  Musculoskeletal: Positive for arthralgias. Negative for joint swelling and myalgias.  Skin: Positive for color change and wound.  Neurological: Negative for dizziness, tremors, seizures, syncope, facial asymmetry, speech difficulty, weakness, light-headedness, numbness and headaches.    Past Medical History:  Diagnosis Date  . Allergy    History reviewed. No pertinent surgical history. No Known Allergies  Social History   Social History  . Marital status: Married    Spouse name: N/A  . Number of children: N/A  . Years of education: N/A   Occupational History  . Writer    Social History Main Topics  . Smoking status: Never Smoker  . Smokeless tobacco: Never Used  . Alcohol use Yes  . Drug use: No  . Sexual activity: Not on file   Other Topics Concern  . Not on file   Social History Narrative  . No narrative on file   Family History  Problem Relation Age of Onset  . Diabetes Father        Objective:    BP 116/77   Pulse 92   Temp 97.8 F (36.6 C) (Oral)   Resp 18   Ht  (1.753 m)   Wt (!) 368 lb (166.9 kg)   SpO2 95%   BMI 54.34 kg/m  Physical Exam  Constitutional: He is oriented to person, place, and time. He appears well-developed and well-nourished. No distress.  HENT:  Head: Normocephalic and atraumatic.  Right Ear: External ear and ear canal normal. Tympanic membrane is retracted.  Left  Ear: External ear normal. Tympanic membrane is retracted.  Nose: Mucosal edema and rhinorrhea present. Right sinus exhibits no maxillary sinus tenderness and no frontal sinus tenderness. Left sinus exhibits no maxillary sinus tenderness and no frontal sinus tenderness.  Mouth/Throat: Uvula is midline, oropharynx is clear and moist and mucous membranes are normal.  Eyes: Conjunctivae and EOM are normal. Pupils are equal, round, and reactive to light.  Neck:  Normal range of motion. Neck supple. Carotid bruit is not present. No thyromegaly present.  Cardiovascular: Normal rate, regular rhythm, normal heart sounds and intact distal pulses.  Exam reveals no gallop and no friction rub.   No murmur heard. Pulmonary/Chest: Effort normal and breath sounds normal. He has no wheezes. He has no rales.  Abdominal: Soft. Bowel sounds are normal. He exhibits no distension and no mass. There is no tenderness. There is no rebound and no guarding.  Genitourinary:  Genitourinary Comments: Open follicle with slightly indurated area at the superior aspect of gluteal fold.  Minimal erythema; no fluctuance; mild TTP.  No pustule present; +eschar present.  Lymphadenopathy:    He has no cervical adenopathy.  Neurological: He is alert and oriented to person, place, and time. No cranial nerve deficit.  Skin: Skin is warm and dry. No rash noted. He is not diaphoretic.  Psychiatric: He has a normal mood and affect. His behavior is normal.  Nursing note and vitals reviewed.  Depression screen [facility] 2/9 01/27/2017 09/30/2016 08/12/2016 08/22/2014 01/18/2014  Decreased Interest 0 0 0 0 0  Down, Depressed, Hopeless 0 0 0 0 0  PHQ - 2 Score 0 0 0 0 0   Fall Risk  01/27/2017 09/30/2016 08/12/2016 08/22/2014 01/18/2014  Falls in the past year? No No No No No       Assessment & Plan:   1. Seasonal allergic rhinitis due to pollen   2. Pain in joint of right shoulder   3. Acute pain of right knee   4. Pilonidal cyst   5. Elevated LFTs    -uncontrolled allergies; rx sent in for Astelin nasal spray and Flonase -new onset pain in R shoulder; home exercise program provided; recommend rest, icing, NSAIDs scheduled for 1-2 weeks and then PRN. -new onset R knee pain: home exercise program recommended; recommend rest, ice, NSAIDS.  If no improvement in 2-4 weeks, call for ortho referral. -persistent pilonidal cyst; no indication for I&D at this time; referral to general surgery per patient  request. 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 -prolonged face to face for 45 minutes due to multiple new acute issues; greater than 50% of time dedicated to coordination of care and counseling.   Orders Placed This Encounter  Procedures  . US Abdomen Complete    EPIC ORDER/ [id number]/NO NEEDS/INS-BCBS/CLC/PT    Standing Status:   Future    Number of Occurrences:   1    Standing Expiration Date:   03/29/2018    Order Specific Question:   Reason for Exam (SYMPTOM  OR DIAGNOSIS REQUIRED)    Answer:   elevated lfts    Order Specific Question:   Preferred imaging location?    Answer:   [facility]  . Ambulatory referral to General Surgery    Referral Priority:   Routine    Referral Type:   Surgical    Referral Reason:   Specialty Services Required    Requested Specialty:   General  Surgery    Number of Visits Requested:   1   Meds ordered this encounter  Medications  . DISCONTD: azelastine (ASTELIN) 0.1 % nasal spray    Sig: Place 2 sprays into both nostrils 2 (two) times daily. Use in each nostril as directed    Dispense:  30 mL    Refill:  12  . fluticasone (FLONASE) 50 MCG/ACT nasal spray    Sig: Place 2 sprays into both nostrils daily.    Dispense:  16 g    Refill:  6  . azelastine (ASTELIN) 0.1 % nasal spray    Sig: Place 2 sprays into both nostrils 2 (two) times daily. Use in each nostril as directed    Dispense:  30 mL    Refill:  12    No Follow-up on file.   Cassidy Tashiro Paulita Fujita, M.D. Primary Care at [facility] previously [facility] [address] [place], [place]  [address] [phone] phone [phone] fax

## 2017-01-29 MED ORDER — AZELASTINE HCL 0.1 % NA SOLN: 2.0000 | Freq: Two times a day (BID) | NASAL | 12 refills | Status: AC

## 2017-02-04 ENCOUNTER — Ambulatory Visit
Admission: RE | Admit: 2017-02-04 | Discharge: 2017-02-04 | Disposition: A | Discharge status: Home / Self Care | Payer: BLUE CROSS/BLUE SHIELD | Source: Ambulatory Visit | Attending: Family Medicine | Admitting: Family Medicine

## 2017-02-04 DIAGNOSIS — R7989 Other specified abnormal findings of blood chemistry: Secondary | ICD-10-CM

## 2017-02-04 DIAGNOSIS — R945 Abnormal results of liver function studies: Principal | ICD-10-CM

## 2017-02-08 ENCOUNTER — Telehealth: Payer: Self-pay | Admitting: Family Medicine

## 2017-02-08 NOTE — Telephone Encounter (Signed)
DR Katrinka Blazing PT WIFE CALLING ABOUT HUSBANDS U/S AND CHECKING UP ON REFERRAL

## 2017-02-18 ENCOUNTER — Telehealth: Payer: Self-pay | Admitting: Family Medicine

## 2017-02-18 NOTE — Telephone Encounter (Signed)
Pt's wife calling following up on pt's referral for general surgery. Pt's wife said the cyst erupted about two weeks ago and was really painful for pt. Referral has not been sent because office notes are still not closed. Please let referrals know when notes are finished and we will send this out. Thanks! Pt's wife Tina's callback number is [phone].

## 2017-02-19 NOTE — Telephone Encounter (Signed)
Pt contacted regarding ultrasound results on 02/10/17.  Referrals please complete referral to general surgery.

## 2017-02-19 NOTE — Telephone Encounter (Signed)
Completed. Please proceed with processing referral.

## 2017-02-22 NOTE — Telephone Encounter (Signed)
Referral sent. Contacted pt's wife Inetta Fermoina to let them know this as well as [facility] Thanks!

## 2017-05-31 IMAGING — US US ABDOMEN COMPLETE
1 series · 14 of 25 positions shown · non-contrast
Comparison: No recent prior .

CLINICAL DATA: Elevated LFTs.

EXAM:
ABDOMEN ULTRASOUND COMPLETE

[Series 1: us abdomen complete · 0.19mm/px · 14 of 64 slices shown]
[im 1/64]
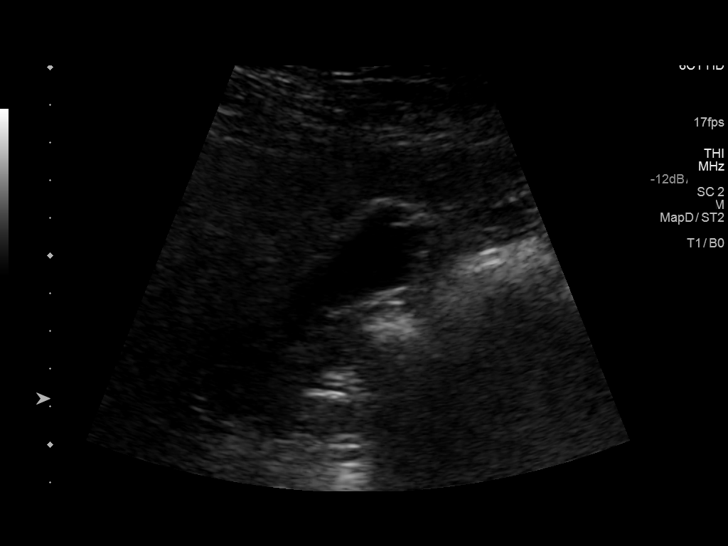
[im 6/64]
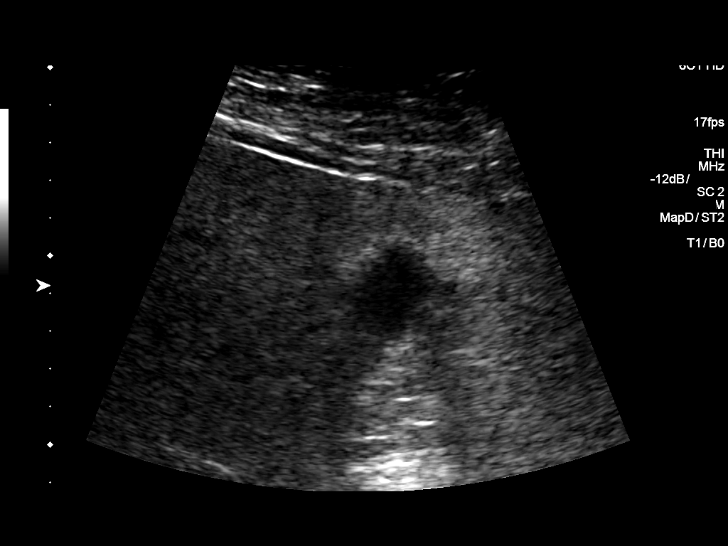
[im 11/64]
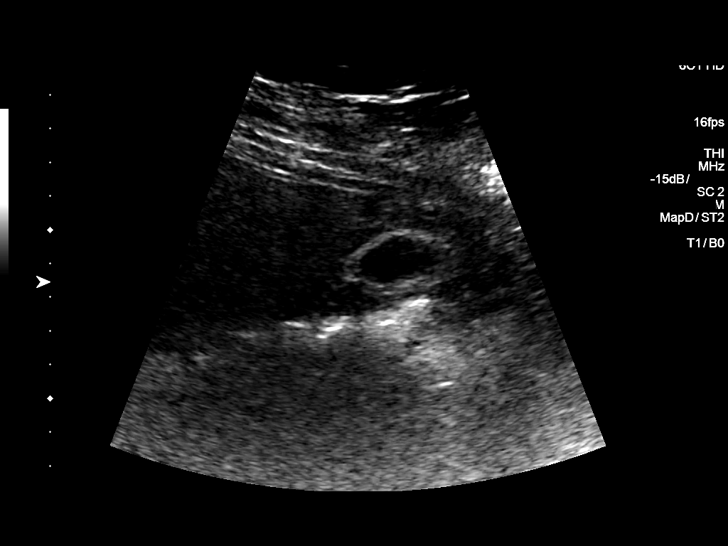
[im 16/64]
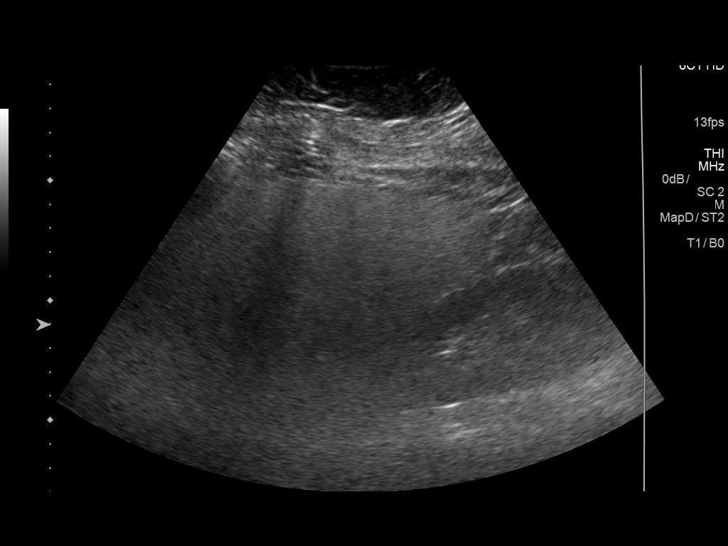
[im 22/64]
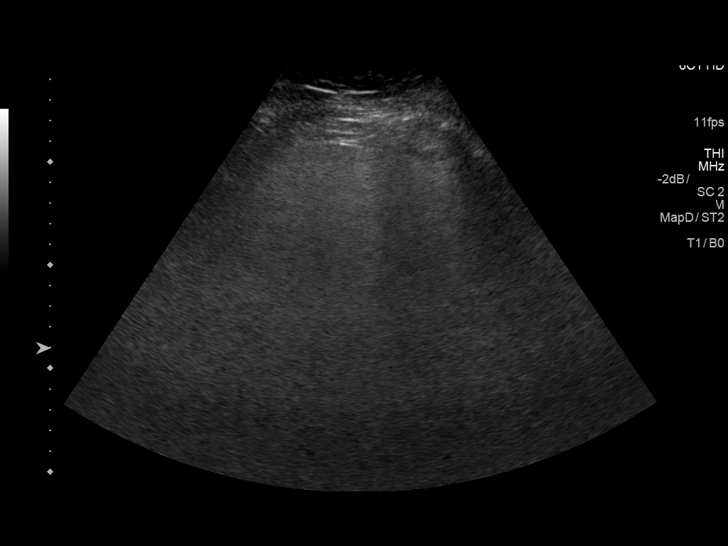
[im 24/64]
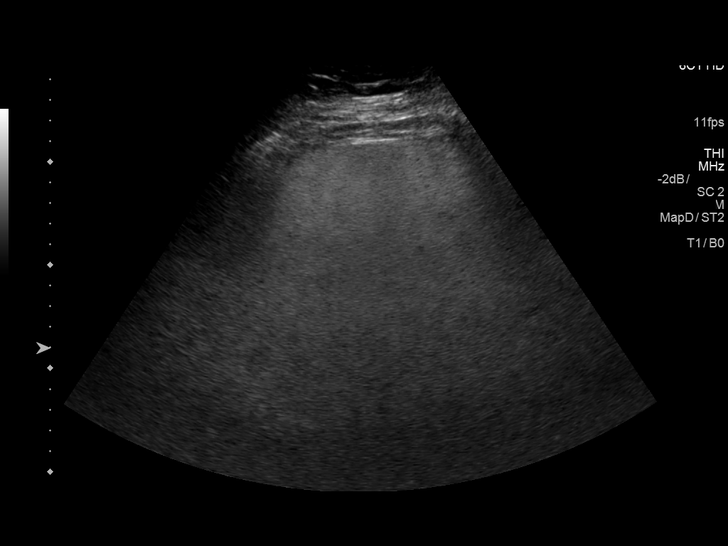
[im 29/64]
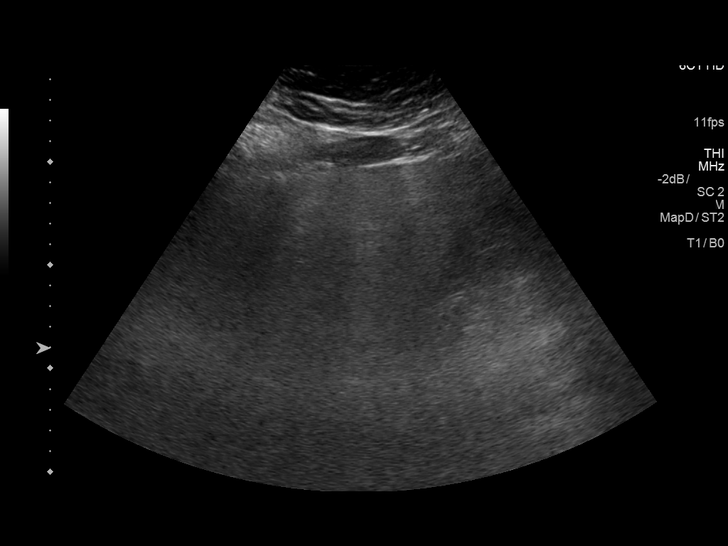
[im 35/64]
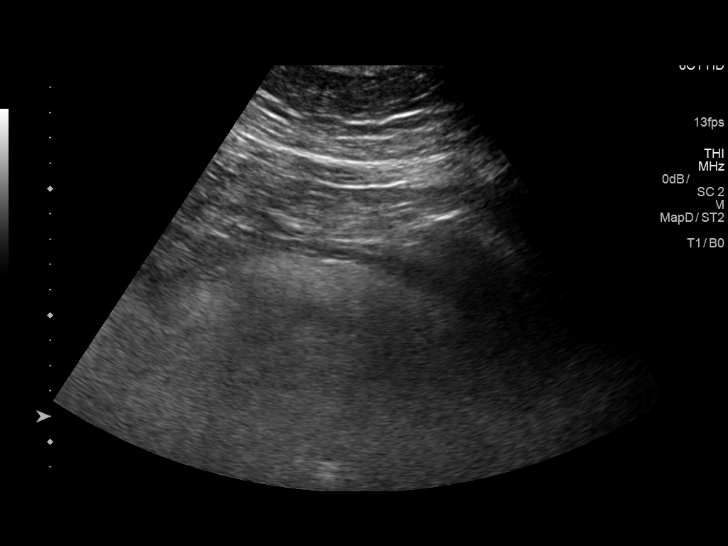
[im 40/64]
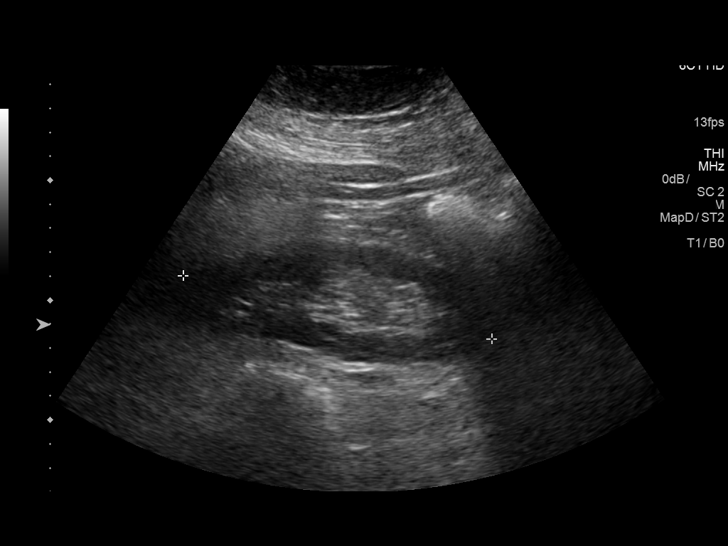
[im 43/64]
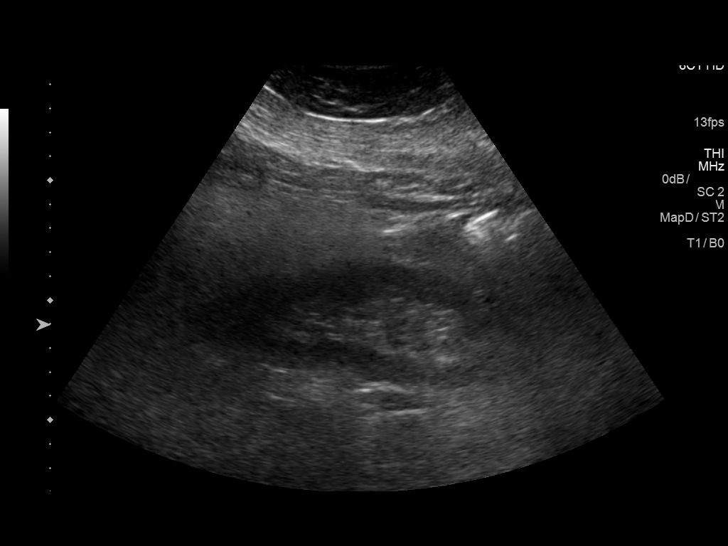
[im 48/64]
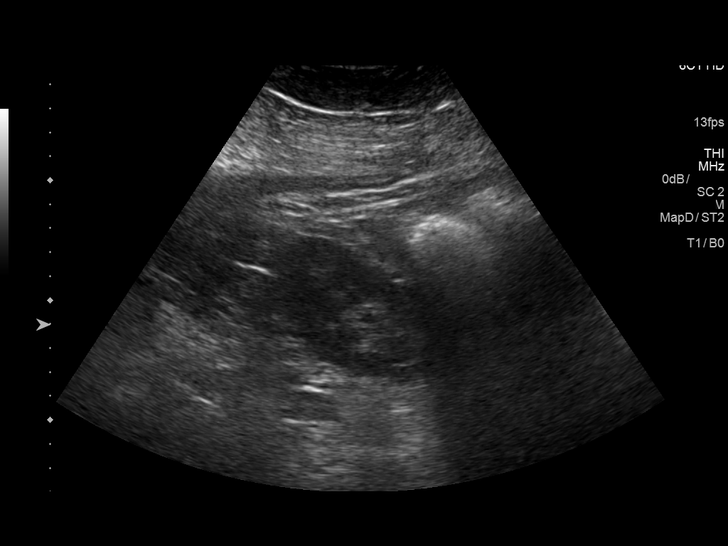
[im 53/64]
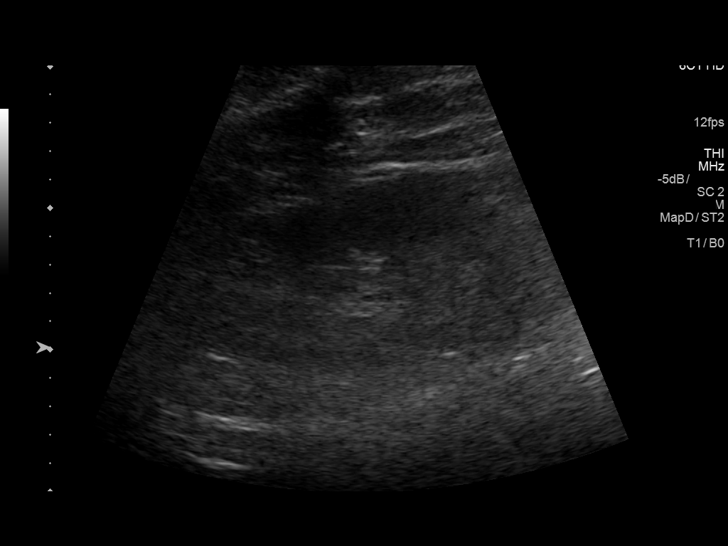
[im 58/64]
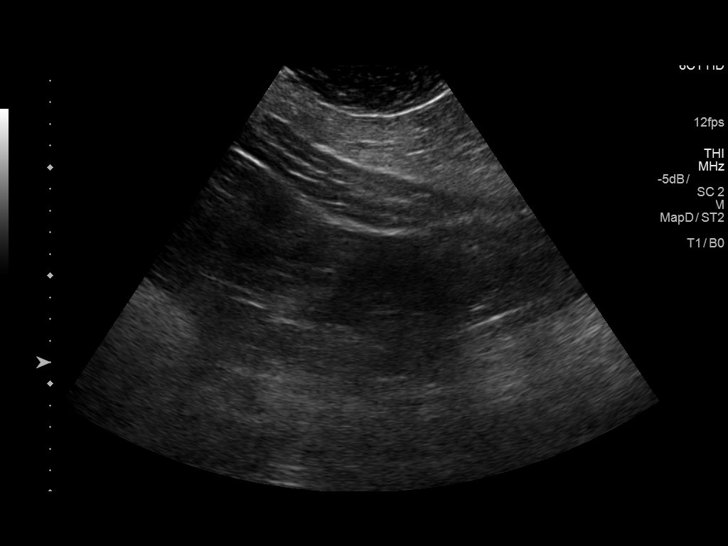
[im 64/64]
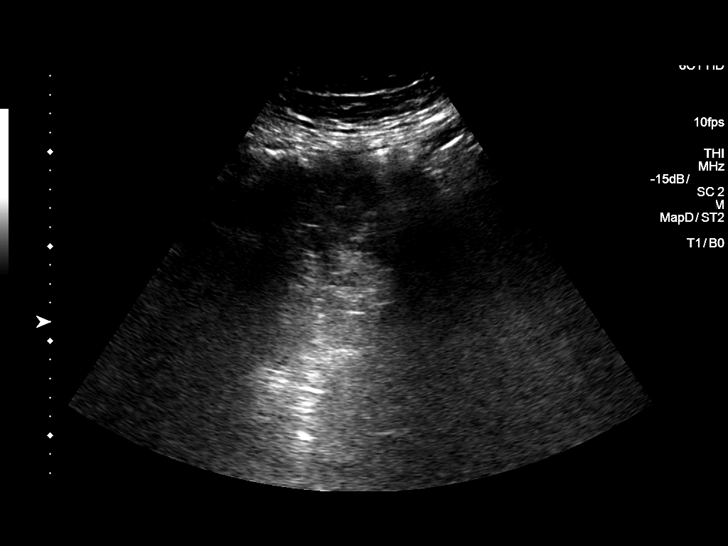

[14 of 25 positions shown; findings below may reference images not displayed]

FINDINGS: Gallbladder: Gallbladder slightly contracted. No gallstones noted.
Gallbladder wall thickness normal. Negative Murphy sign.

Common bile duct: Diameter: 4.5 mm

Liver: Increased hepatic echogenicity consistent fatty infiltration
and/or hepatocellular disease. No focal hepatic abnormality
identified.

IVC: No abnormality visualized.

Pancreas: Visualized portion unremarkable.

Spleen: Size and appearance within normal limits.

Right Kidney: Length: 13.1 cm. Echogenicity within normal limits. No
mass or hydronephrosis visualized.

Left Kidney: Length: 14.0 cm. Echogenicity within normal limits. No
mass or hydronephrosis visualized.

Abdominal aorta: No aneurysm visualized.

Other findings: Exam limited due to patient's body habitus.
IMPRESSION: 1. Partially contracted gallbladder. No gallstones identified. No
biliary distention.

2. Increased hepatic echogenicity consistent fatty infiltration
and/or hepatocellular disease .

## 2018-03-03 ENCOUNTER — Encounter: Payer: Self-pay | Admitting: Family Medicine

## 2018-05-30 DIAGNOSIS — R7989 Other specified abnormal findings of blood chemistry: Secondary | ICD-10-CM | POA: Insufficient documentation

## 2018-05-30 DIAGNOSIS — K760 Fatty (change of) liver, not elsewhere classified: Secondary | ICD-10-CM | POA: Insufficient documentation

## 2018-05-30 DIAGNOSIS — F3177 Bipolar disorder, in partial remission, most recent episode mixed: Secondary | ICD-10-CM | POA: Insufficient documentation

## 2020-01-05 ENCOUNTER — Ambulatory Visit: Payer: BLUE CROSS/BLUE SHIELD | Attending: Internal Medicine

## 2020-01-05 DIAGNOSIS — Z23 Encounter for immunization: Secondary | ICD-10-CM

## 2020-01-05 NOTE — Progress Notes (Signed)
   Covid-19 Vaccination Clinic  Name:  Todd Roach    MRN: [id number] DOB: [birth year]  01/05/2020  [name] was observed post Covid-19 immunization for 15 minutes without incident. He was provided with Vaccine Information Sheet and instruction to access the V-Safe system.   [name] was instructed to call 911 with any severe reactions post vaccine: Marland Kitchen Difficulty breathing  . Swelling of face and throat  . A fast heartbeat  . A bad rash all over body  . Dizziness and weakness   Immunizations Administered    Name Date Dose VIS Date Route   Pfizer COVID-19 Vaccine 01/05/2020 12:12 PM 0.3 mL 09/22/2019 Intramuscular   Manufacturer: [employer], [employer]   Lot: [id number]   NDC: [address]-4

## 2020-01-30 ENCOUNTER — Ambulatory Visit: Payer: BLUE CROSS/BLUE SHIELD | Attending: Internal Medicine

## 2020-01-30 DIAGNOSIS — Z23 Encounter for immunization: Secondary | ICD-10-CM

## 2020-01-30 NOTE — Progress Notes (Signed)
   Covid-19 Vaccination Clinic  Name:  Todd Roach    MRN: [id number] DOB: 06-01-1971  01/30/2020  [name] was observed post Covid-19 immunization for 15 minutes without incident. He was provided with Vaccine Information Sheet and instruction to access the V-Safe system.   [name] was instructed to call 911 with any severe reactions post vaccine: Marland Kitchen Difficulty breathing  . Swelling of face and throat  . A fast heartbeat  . A bad rash all over body  . Dizziness and weakness   Immunizations Administered    Name Date Dose VIS Date Route   Pfizer COVID-19 Vaccine 01/30/2020 11:58 AM 0.3 mL 12/06/2018 Intramuscular   Manufacturer: [employer], [employer]   Lot: [id number]   NDC: [address]-7

## 2023-03-17 ENCOUNTER — Ambulatory Visit (INDEPENDENT_AMBULATORY_CARE_PROVIDER_SITE_OTHER): Payer: Medicaid Other | Admitting: Internal Medicine

## 2023-03-17 ENCOUNTER — Encounter: Payer: Self-pay | Admitting: Internal Medicine

## 2023-03-17 VITALS — BP 144/98 | HR 81 | Temp 98.4°F | Ht 69.0 in | Wt >= 6400 oz

## 2023-03-17 DIAGNOSIS — Q870 Congenital malformation syndromes predominantly affecting facial appearance: Secondary | ICD-10-CM

## 2023-03-17 DIAGNOSIS — I10 Essential (primary) hypertension: Secondary | ICD-10-CM

## 2023-03-17 DIAGNOSIS — Z1211 Encounter for screening for malignant neoplasm of colon: Secondary | ICD-10-CM

## 2023-03-17 DIAGNOSIS — K760 Fatty (change of) liver, not elsewhere classified: Secondary | ICD-10-CM | POA: Diagnosis not present

## 2023-03-17 DIAGNOSIS — Z6841 Body Mass Index (BMI) 40.0 and over, adult: Secondary | ICD-10-CM | POA: Diagnosis not present

## 2023-03-17 DIAGNOSIS — Z23 Encounter for immunization: Secondary | ICD-10-CM | POA: Diagnosis not present

## 2023-03-17 DIAGNOSIS — Z0000 Encounter for general adult medical examination without abnormal findings: Secondary | ICD-10-CM

## 2023-03-17 DIAGNOSIS — G4733 Obstructive sleep apnea (adult) (pediatric): Secondary | ICD-10-CM | POA: Diagnosis not present

## 2023-03-17 DIAGNOSIS — Z119 Encounter for screening for infectious and parasitic diseases, unspecified: Secondary | ICD-10-CM

## 2023-03-17 DIAGNOSIS — E291 Testicular hypofunction: Secondary | ICD-10-CM

## 2023-03-17 DIAGNOSIS — E7800 Pure hypercholesterolemia, unspecified: Secondary | ICD-10-CM

## 2023-03-17 LAB — COMPREHENSIVE METABOLIC PANEL
ALT: 65 U/L — ABNORMAL HIGH (ref 0–53)
AST: 33 U/L (ref 0–37)
Albumin: 4.1 g/dL (ref 3.5–5.2)
Alkaline Phosphatase: 83 U/L (ref 39–117)
BUN: 10 mg/dL (ref 6–23)
CO2: 26 mEq/L (ref 19–32)
Calcium: 9 mg/dL (ref 8.4–10.5)
Chloride: 99 mEq/L (ref 96–112)
Creatinine, Ser: 0.7 mg/dL (ref 0.40–1.50)
GFR: 106.38 mL/min (ref >60.00)
Glucose, Bld: 101 mg/dL — ABNORMAL HIGH (ref 70–99)
Potassium: 4.3 mEq/L (ref 3.5–5.1)
Sodium: 139 mEq/L (ref 135–145)
Total Bilirubin: 0.4 mg/dL (ref 0.2–1.2)
Total Protein: 7.5 g/dL (ref 6.0–8.3)

## 2023-03-17 LAB — LIPID PANEL
Cholesterol: 182 mg/dL (ref 0–200)
HDL: 47 mg/dL (ref >39.00)
LDL Cholesterol: 106 mg/dL — ABNORMAL HIGH (ref 0–99)
NonHDL: 135.29
Total CHOL/HDL Ratio: 4
Triglycerides: 146 mg/dL (ref 0.0–149.0)
VLDL: 29.2 mg/dL (ref 0.0–40.0)

## 2023-03-17 LAB — CBC WITH DIFFERENTIAL/PLATELET
Basophils Absolute: 0 10*3/uL (ref 0.0–0.1)
Basophils Relative: 0.4 % (ref 0.0–3.0)
Eosinophils Absolute: 0.1 10*3/uL (ref 0.0–0.7)
Eosinophils Relative: 1.2 % (ref 0.0–5.0)
HCT: 43.1 % (ref 39.0–52.0)
Hemoglobin: 14.2 g/dL (ref 13.0–17.0)
Lymphocytes Relative: 23.9 % (ref 12.0–46.0)
Lymphs Abs: 1.5 10*3/uL (ref 0.7–4.0)
MCHC: 33 g/dL (ref 30.0–36.0)
MCV: 91.6 fl (ref 78.0–100.0)
Monocytes Absolute: 0.6 10*3/uL (ref 0.1–1.0)
Monocytes Relative: 9.7 % (ref 3.0–12.0)
Neutro Abs: 4.2 10*3/uL (ref 1.4–7.7)
Neutrophils Relative %: 64.8 % (ref 43.0–77.0)
Platelets: 152 10*3/uL (ref 150.0–400.0)
RBC: 4.7 Mil/uL (ref 4.22–5.81)
RDW: 13.9 % (ref 11.5–15.5)
WBC: 6.4 10*3/uL (ref 4.0–10.5)

## 2023-03-17 LAB — TSH: TSH: 2.84 u[IU]/mL (ref 0.35–5.50)

## 2023-03-17 LAB — HEMOGLOBIN A1C: Hgb A1c MFr Bld: 5.6 % (ref 4.6–6.5)

## 2023-03-17 MED ORDER — LOSARTAN POTASSIUM 25 MG PO TABS
25.0000 mg | ORAL_TABLET | Freq: Every day | ORAL | 3 refills | Status: DC
Start: 2023-03-17 — End: 2024-08-16

## 2023-03-17 NOTE — Assessment & Plan Note (Signed)
Will try metformin and topiramate Medicaid won't cover GLP-1 agonist Discussed if problem we should revisit bariatrics

## 2023-03-17 NOTE — Progress Notes (Signed)
[facility]  Phone: [phone]  New patient visit  Visit Date: 03/17/2023 Patient: Todd Roach   DOB: [birth year]   52 y.o. Male  MRN: [id number] PCP:  Lula Olszewski, MD  (establishing care today)  Today's Health Care Provider: Lula Olszewski, MD   Assessment and Plan:    Chandler was seen today for new patient (initial visit) and annual exam.  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 Great intelligence- has masters Not interested in academic referral  Orders: -     CBC with Differential/Platelet -     Comprehensive metabolic panel -     Ambulatory referral to Endocrinology  Morbid obesity with BMI of 60.0-69.9, adult (HCC) Overview: Increased body mass noted.  Body mass index is 60.46 kg/m.  Well proportioned with no abnormal fat distribution.  Good muscle tone.  Lab Results  Component Value Date   TSH 2.35 08/12/2016   Lab Results  Component Value Date   HGBA1C 5.2 08/12/2016    Wt Readings from Last 10 Encounters:  03/17/23 (!) 409 lb 6.4 oz (185.7 kg)  01/27/17 (!) 368 lb (166.9 kg)  09/30/16 (!) 367 lb (166.5 kg)  08/12/16 (!) 370 lb (167.8 kg)  08/22/14 (!) 356 lb (161.5 kg)  08/21/14 (!) 358 lb (162.4 kg)  08/17/14 (!) 358 lb (162.4 kg)  08/14/14 (!) 359 lb 3.2 oz (162.9 kg)  08/12/14 (!) 364 lb (165.1 kg)  02/26/14 (!) 353 lb (160.1 kg)     Assessment & Plan: Will try metformin and topiramate Medicaid won't cover GLP-1 agonist Discussed if problem we should revisit bariatrics  Orders: -     CBC with Differential/Platelet -     Comprehensive metabolic panel -     Lipid panel -     TSH -     Hemoglobin A1c  Hypogonadism male Overview: Prior history: 04/02/2014. [facility].   Matthew Levy,MD. 1. Testosterone deficiency:  April 2015 labs found total testosterone very low at 101 but normal Free Testosterone 17.8. FSH was slightly elevated and Prolactin was high-normal. Andrian's health insurance would not authorize an MRI Brain to look for pituitary pathology.  We decided that the free testosterone may be an error since he had symptoms and physical exam findings consistent with hypogonadism. Shigeo was improved for the 1st week but reappeared in the 2nd week. Follow-up labs on 03/29/14 showed no change with total testosterone 101 and free testosterone 17.1.  One possible explanation for this discrepancy is decreased sex hormone binding globulin production.  I'll raise Naod's Testosterone Cypionate to 100mg  once a week. He will repeat labs in 8 weeks including pituitary axis hormones for re evaluation on testosterone replacement.  Assessment & Plan: Refer back to endo now that he has insurance again. Has fallen off testosterone replacement. Suspect complex endocrine disorder(s) given morbid obesity and moebius syndrome and patient reports one testicle being atrophied.  Orders: -     CBC with Differential/Platelet -     Comprehensive metabolic panel -     Ambulatory referral to Endocrinology -     Testosterone, Free, Total, SHBG  Fatty liver -     FIB-4 w/Rx NASH FibroSure Plus  Pure hypercholesterolemia Overview: Medications: not  yet discussed Lab Results  Component Value Date   HDL 40 08/12/2016   HDL 43 08/22/2014   HDL 36 (L) 09/16/2012   CHOLHDL 5.4 (H) 08/12/2016   CHOLHDL 4.6 08/22/2014   CHOLHDL 4.9 09/16/2012   Lab Results  Component Value Date   LDLCALC 141 (H) 08/12/2016   LDLCALC 118 (H) 08/22/2014   LDLCALC 107 (H) 09/16/2012   Lab Results  Component Value Date   TRIG 167 (H) 08/12/2016   TRIG 175 (H) 08/22/2014   TRIG 166 (H) 09/16/2012   Lab Results  Component Value Date   CHOL 214 (H) 08/12/2016   CHOL 196 08/22/2014   CHOL 176 09/16/2012    The 10-year ASCVD risk score (Arnett DK, et al., 2019) is: 5.1%   Values used to calculate the score:     Age: 68 years     Sex: Male     Is Non-Hispanic African American: No     Diabetic: No     Tobacco smoker: No     Systolic Blood Pressure: 146 mmHg     Is BP treated: No     HDL Cholesterol: 42 MG/DL     Total Cholesterol: 184 MG/DL Lab Results  Component Value Date   ALT 64 (H) 09/30/2016   AST 34 09/30/2016   ALKPHOS 72 09/30/2016   TSH 2.35 08/12/2016   HGBA1C 5.2 08/12/2016   Body mass index is 60.46 kg/m.  Lipoprotein(a), Apolipoprotein B (ApoB), and High-sensitivity C-reactive protein (hs-CRP) No results found for: "HSCRP", "LIPOA" Improving Your Cholesterol: Diet: Focus on a Mediterranean-style diet, limit saturated fats and sugars, and increase omega-3 fatty acids (fish, flaxseeds,nuts,extra virgin olive oil, avocados). Exercise: Engage in regular physical activity (aerobic exercises are particularly beneficial for HDL). Weight Management: Maintain a healthy weight. Smoking Cessation: Quitting smoking improves cholesterol levels.   Assessment & Plan: Diet: Focus on a Mediterranean-style diet, limit saturated fats and sugars, and increase omega-3 fatty acids (fish, flaxseeds,nuts,extra virgin olive oil, avocados). Exercise: Engage in regular physical activity (aerobic exercises are particularly beneficial for HDL). Weight Management: Maintain a healthy weight. Smoking Cessation: Quitting smoking improves cholesterol levels.     Screening examination for infectious disease -     Hepatitis C antibody  Need for shingles vaccine -     Varicella-zoster vaccine IM  Primary hypertension -     Losartan Potassium; Take 1 tablet (25 mg total) by mouth daily.  Dispense: 90 tablet; Refill: 3    Based on [employer] AI conversational text extraction, alternative Assessment and Plan    Obstructive Sleep Apnea: Likely given symptoms of snoring, stopping breathing during  sleep, and need for stimulant medication in the morning. Neck anatomy also suggestive. -Refer to sleep specialist for further evaluation and potential initiation of CPAP therapy.  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 Patient has a history of speech therapy and surgical interventions. -No new interventions planned at this time.  Hypogonadism: History of low total testosterone likely secondary to liver disease and atrophied testicle. Free testosterone previously normal. -Refer to endocrinology for further evaluation and potential testosterone replacement therapy. -Order comprehensive testosterone panel including total, free, and sex hormone binding globulin.  Obesity: BMI of 60, contributing to multiple health issues including fatty liver, hypogonadism, and likely sleep apnea. -Initiate metformin and topiramate for weight loss. -Discussed potential future consideration of bariatric surgery if medical management is unsuccessful.  Hypertension: Elevated blood pressure likely secondary to untreated  sleep apnea. -Initiate losartan and monitor response. -Schedule follow-up appointment in 1-2 weeks to reassess blood pressure control.  Fatty Liver: Likely secondary to obesity. -Advise weight loss and low carbohydrate diet. -Initiate metformin which may also benefit fatty liver.  Hyperlipidemia: History of high cholesterol. -Order lipid panel. -Discussed potential future initiation of cholesterol-lowering medication pending results.  General Health Maintenance: -Order comprehensive blood work including A1C, thyroid function tests, complete blood count, metabolic panel, and hepatitis C screen. -Order Cologuard for colorectal cancer screening. -Administer shingles vaccine. -Schedule follow-up appointment in 1-2 weeks to review lab results and assess response to new medications.              Clinical Presentation:    Patient affirms intent to establish a primary care provider relationship with Lula Olszewski, MD going forward.  52 y.o. male  with main concerns (chief complaints) today expressed as New Patient (Initial Visit) and Annual Exam   Discussed the use of AI scribe software for clinical note transcription with the patient, who gave verbal consent to proceed.  History of Present Illness   The patient, with a known history of fatty liver disease, hypogonadism, Mobius syndrome, hypercholesterolemia, and bipolar disorder, presents for a new patient physical and to establish care. They report taking armodafinil, a mild stimulant, to help with morning alertness. They are unsure if they have sleep apnea, but they do report snoring and cessation of breathing during sleep.  The patient has a history of Mobius syndrome, a rare condition characterized by lack of development of certain cranial nerves. They report inability to smile or close their lips, and difficulty moving their eyes side to side. They have undergone speech therapy as a child due to difficulty making certain sounds. They also report having an atrophied testicle, which they believe may contribute to their hypogonadism.  The patient is currently managing their bipolar disorder with Latuda and Lamictal. They report their mood has been stable recently. They express concern about their weight, acknowledging that it is likely contributing to their health issues.  The patient also reports a history of liver injury, likely related to their weight and hypercholesterolemia. They are not currently on any cholesterol medication, but are open to starting one if necessary. They have been checking their blood pressure at home and report it to be around 140. They are aware of the potential link between sleep apnea and hypertension.        Comprehensive chart development today: Problems:has Moebius syndrome; Morbid obesity with BMI  of 60.0-69.9, adult (HCC); Hypogonadism male; Testosterone deficiency; Pure hypercholesterolemia; Bipolar disorder, in partial remission, most recent episode mixed (HCC); Elevated LFTs; and Fatty liver on their problem list. Current Meds  Medication Sig   Armodafinil 200 MG TABS Take 1 tablet by mouth every morning.   azelastine (ASTELIN) 0.1 % nasal spray Place 2 sprays into both nostrils 2 (two) times daily. Use in each nostril as directed   cetirizine (ZYRTEC) 10 MG tablet Take 10 mg by mouth daily.   fexofenadine (ALLEGRA) 60 MG tablet Take by mouth.   fluticasone (FLONASE) 50 MCG/ACT nasal spray Place 2 sprays into both nostrils daily.   ketoconazole (NIZORAL) 2 % shampoo APPLY TOPICALLY 2 TIMES A WEEK   lamoTRIgine (LAMICTAL) 100 MG tablet Take 2 tablets by mouth daily.   losartan (COZAAR) 25 MG tablet Take 1 tablet (25 mg total) by mouth daily.   Lurasidone HCl 60 MG TABS SMARTSIG:1 Tablet(s) By Mouth Every Evening   Multiple Vitamin (MULTIVITAMIN)  tablet Take 1 tablet by mouth daily.   Allergies:  reports current alcohol use. Past Medical History  has a past medical history of Allergy. Past Surgical History  has no past surgical history on file. Social History  reports that he has never smoked. He has never used smokeless tobacco. He reports current alcohol use. He reports that he does not use drugs. Family History family history includes Diabetes in his father.  Depression Screen and Health Maintenance:    03/17/2023   10:12 AM 01/27/2017    3:57 PM 09/30/2016   10:33 AM 08/12/2016    3:03 PM  PHQ 2/9 Scores  PHQ - 2 Score 0 0 0 0   Health Maintenance  Topic Date Due   Hepatitis C Screening  Never done   Colonoscopy  Never done   Zoster Vaccines- Shingrix (2 of 2) 05/12/2023   INFLUENZA VACCINE  05/13/2023   DTaP/Tdap/Td (2 - Td or Tdap) 08/12/2026   HIV Screening  Completed   HPV VACCINES  Aged Out   COVID-19 Vaccine  Discontinued   Immunization History  Administered  Date(s) Administered   Influenza, Seasonal, Injecte, Preservative Fre 09/16/2012   Influenza,inj,Quad PF,6+ Mos 08/22/2014, 08/12/2016, 09/08/2021   Influenza-Unspecified 09/16/2012   PFIZER Comirnaty(Gray Top)Covid-19 Tri-Sucrose Vaccine 01/05/2020, 01/30/2020   PFIZER(Purple Top)SARS-COV-2 Vaccination 01/05/2020, 01/30/2020   Tdap 08/12/2016   Zoster Recombinat (Shingrix) 03/17/2023        Objective:  Physical Exam  BP (!) 144/98 (BP Location: Left Arm, Patient Position: Sitting)   Pulse 81   Temp 98.4 F (36.9 C) (Temporal)   Ht 5\' 9"  (1.753 m)   Wt (!) 409 lb 6.4 oz (185.7 kg)   SpO2 93%   BMI 60.46 kg/m  Wt Readings from Last 10 Encounters:  03/17/23 (!) 409 lb 6.4 oz (185.7 kg)  01/27/17 (!) 368 lb (166.9 kg)  09/30/16 (!) 367 lb (166.5 kg)  08/12/16 (!) 370 lb (167.8 kg)  08/22/14 (!) 356 lb (161.5 kg)  08/21/14 (!) 358 lb (162.4 kg)  08/17/14 (!) 358 lb (162.4 kg)  08/14/14 (!) 359 lb 3.2 oz (162.9 kg)  08/12/14 (!) 364 lb (165.1 kg)  02/26/14 (!) 353 lb (160.1 kg)   Vital signs reviewed.  Nursing notes reviewed. Weight trend reviewed. Abnormalities and problem-specific physical exam findings:  marked truncal adiposity,  General Appearance:  Well developed, well nourished, well-groomed, healthy-appearing male with Body mass index is 60.46 kg/m. No acute distress appreciable.   Skin: Clear and well-hydrated. Pulmonary:  Normal work of breathing at rest, no respiratory distress apparent. SpO2: 93 %  Musculoskeletal: He demonstrates smooth and coordinated movements throughout all major joints.All extremities are intact.  Neurological:  Awake, alert, oriented, and engaged.  No obvious focal neurological deficits or cognitive impairments.  Sensorium seems unclouded.  Psychiatric:  Appropriate mood, pleasant and cooperative demeanor, cheerful and engaged during the exam  Reviewed Results    Results for orders placed or performed in visit on 09/30/16  Comprehensive  metabolic panel  Result Value Ref Range   Glucose 94 65 - 99 mg/dL   BUN 14 6 - 24 mg/dL   Creatinine, Ser 4.09 (L) 0.76 - 1.27 mg/dL   GFR calc non Af Amer 117 >59 mL/min/1.73   GFR calc Af Amer 136 >59 mL/min/1.73   BUN/Creatinine Ratio 22 (H) 9 - 20   Sodium 141 134 - 144 mmol/L   Potassium 4.8 3.5 - 5.2 mmol/L   Chloride 101 96 - 106 mmol/L  CO2 29 18 - 29 mmol/L   Calcium 9.3 8.7 - 10.2 mg/dL   Total Protein 7.2 6.0 - 8.5 g/dL   Albumin 4.5 3.5 - 5.5 g/dL   Globulin, Total 2.7 1.5 - 4.5 g/dL   Albumin/Globulin Ratio 1.7 1.2 - 2.2   Bilirubin Total 0.3 0.0 - 1.2 mg/dL   Alkaline Phosphatase 72 39 - 117 IU/L   AST 34 0 - 40 IU/L   ALT 64 (H) 0 - 44 IU/L  Pathology Report  Result Value Ref Range   . Comment    . Comment    . Comment    . Comment    . Comment    . Comment    . Comment    . Comment     No results found for any visits on 03/17/23.  No results found for this or any previous visit (from the past 2160 hour(s)).  No image results found.   No results found.      Additional notes: Initial Appointment Goals:  This initial visit focused on establishing a foundation for the patient's care. We collaboratively reviewed his medical history and medications in detail, updating the chart as shown in the encounter. Given the extensive information, we prioritized addressing his most pressing concerns, which he reported were: New Patient (Initial Visit) and Annual Exam  While the complexity of the patient's medical picture may necessitate further evaluation in subsequent visits, we were able to develop a preliminary care plan together. To expedite a comprehensive plan at the next visit, we encouraged the patient to gather relevant medical records from previous providers. This collaborative approach will ensure a more complete understanding of the patient's health and inform the development of a personalized care plan. We look forward to continuing the conversation and  working together with the patient on achieving his health goals.   Collaborative Documentation:  Today's encounter utilized real-time, dynamic patient engagement.  Patients actively participate by directly reviewing and assisting in updating their medical records through a shared screen. This transparency empowers patients to visually confirm chart updates made by the healthcare provider.  This collaborative approach facilitates problem management as we jointly update the problem list, problem overview, and assessment/plan. Ultimately, this process enhances chart accuracy and completeness, fostering shared decision-making, patient education, and informed consent for tests and treatments.  Collaborative Treatment Planning:  Treatment plans were discussed and reviewed in detail.  Explained medication safety and potential side effects.  Encouraged participation and answered all patient questions, confirming understanding and comfort with the plan. Encouraged patient to contact our office if they have any questions or concerns. Agreed on patient returning to office if symptoms worsen, persist, or new symptoms develop. Discussed precautions in case of needing to visit the Emergency Department.  ----------------------------------------------------- Lula Olszewski, MD  03/17/2023 11:28 AM  [facility]:  [phone]

## 2023-03-17 NOTE — Assessment & Plan Note (Signed)
Diet: Focus on a Mediterranean-style diet, limit saturated fats and sugars, and increase omega-3 fatty acids (fish, flaxseeds,nuts,extra virgin olive oil, avocados). Exercise: Engage in regular physical activity (aerobic exercises are particularly beneficial for HDL). Weight Management: Maintain a healthy weight. Smoking Cessation: Quitting smoking improves cholesterol levels.

## 2023-03-17 NOTE — Patient Instructions (Addendum)
Welcome aboard!   Today's visit was a valuable first step in understanding your health and starting your personalized care journey. We discussed your medical history and medications in detail. Given the extensive information, we prioritized addressing your most pressing concerns.  We understood those concerns to be:  New Patient (Initial Visit) and Annual Exam   Building a Complete Picture  To create the most effective care plan possible, we may need additional information from previous providers. We encouraged you to gather any relevant medical records for your next visit. This will help [place] build a more complete picture and develop a personalized plan together. In the meantime, we'll address your immediate concerns and provide resources to help you manage all of your medical issues.  We encourage you to use MyChart to review these efforts, and to help [place] find and correct any omissions or errors in your medical chart.  Managing Your Health Over Time  Managing every aspect of your health in a single visit isn't always feasible, but that's okay.  We addressed your most pressing concerns today and charted a course for future care. Acute conditions or preventive care measures may require further attention.  We encourage you to schedule a follow-up visit at your earliest convenience to discuss any unresolved issues.  We strongly encourage participation in annual preventive care visits to help [place] develop a more thorough understanding of your health and to help you maintain optimal wellness - please inquire about scheduling your next one with [place] at your earliest convenience.  Your Satisfaction Matters  It was a pleasure seeing you today!  Your health and satisfaction will always be my top priorities. If you believe your experience today was worthy of a 5-star rating, I'd be grateful for your feedback!  Lula Olszewski, MD  VISIT SUMMARY:  During your visit, we discussed your health concerns including  fatty liver disease, hypogonadism, Mobius syndrome, high cholesterol, and bipolar disorder. We also addressed your symptoms of snoring and stopping breathing during sleep, which may indicate sleep apnea. Your weight was also a topic of discussion, as it may be contributing to several of your health issues.  YOUR PLAN:  -OBSTRUCTIVE SLEEP APNEA: This is a condition where your breathing repeatedly stops and starts during sleep. We will refer you to a sleep specialist for further evaluation and potential treatment with a CPAP machine, which helps keep your airway open while you sleep.  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 At this time, no new interventions are planned.  -HYPOGONADISM: This is a condition where your body doesn't produce enough testosterone. We will refer you to an endocrinologist for further evaluation and potential testosterone replacement therapy. We will also order a comprehensive testosterone panel.  -OBESITY: Your body mass index (BMI) is 60, which is in the obesity range. This is contributing to several of your health issues. We will start you on metformin and topiramate for weight loss and discuss the potential for bariatric surgery if medical management is unsuccessful.  -HYPERTENSION: This is a condition where your blood pressure is consistently too high, likely due to untreated sleep apnea. We will start you on losartan and monitor your response.  -FATTY LIVER: This is a condition where you have too much fat in your liver, likely due to obesity. We will advise weight loss and a low carbohydrate diet. We will also start you on metformin, which may benefit your fatty liver.  -HYPERLIPIDEMIA: This is  a condition where you have high levels of fats in your blood. We will order a lipid panel and discuss potential future initiation of cholesterol-lowering medication pending results.  INSTRUCTIONS:  We have  ordered comprehensive blood work, including A1C, thyroid function tests, complete blood count, metabolic panel, and hepatitis C screen. We have also ordered a Cologuard test for colorectal cancer screening and administered a shingles vaccine. Please schedule a follow-up appointment in 1-2 weeks to review lab results and assess response to new medications.     Next Steps  Schedule Follow-Up:  We recommend a follow-up appointment in Return in about 2 weeks (around 03/31/2023) for chronic disease monitoring and management. If your condition worsens before then, please call us or seek emergency care. Preventive Care:  Don't forget to schedule your annual preventive care visit!  This important checkup is typically covered by insurance and helps identify potential health issues early.  Typically its 100% insurance covered with no co-pay and helps to get surveillance labwork paid for through your insurance provider.  Sometimes it even lowers your insurance premiums to participate. Medical Information Release:  For any relevant medical information we don't have, please sign a release form so we can obtain it for your records. Lab & X-ray Appointments:  Scheduled any incomplete lab tests today or call us to schedule.  X-Rays can be done without an appointment at [facility] at [facility] ([address], [address]), M-F 8:30am-noon or 1pm-5pm.  Just tell them you're there for X-rays ordered by Dr. Jon Billings.  We'll receive the results and contact you by phone or MyChart to discuss next steps.  Bring to Your Next Appointment  Medications: Please bring all your medication bottles to your next appointment to ensure we have an accurate record of your prescriptions. Health Diaries: If you're monitoring any health conditions at home, keeping a diary of your readings can be very helpful for discussions at your next appointment.  Reviewing Your Records  Please Review this early draft of your clinical notes below  and the final encounter summary tomorrow on MyChart after its been completed.   Screen for colon cancer -     Cologuard  Preventative health care -     Varicella-zoster vaccine IM  OSA (obstructive sleep apnea) -     Ambulatory referral to Sleep Studies  Moebius syndrome -     CBC with Differential/Platelet -     Comprehensive metabolic panel -     Ambulatory referral to Endocrinology  Morbid obesity with BMI of 60.0-69.9, adult [facility]) Assessment & Plan: Will try metformin and topiramate Medicaid won't cover GLP-1 agonist Discussed if problem we should revisit bariatrics  Orders: -     CBC with Differential/Platelet -     Comprehensive metabolic panel -     Lipid panel -     TSH -     Hemoglobin A1c  Hypogonadism male Assessment & Plan: Refer back to endo now that he has insurance again. Has fallen off testosterone replacement. Suspect complex endocrine disorder(s) given morbid obesity and moebius syndrome and patient reports one testicle being atrophied.  Orders: -     CBC with Differential/Platelet -     Comprehensive metabolic panel -     Ambulatory referral to Endocrinology -     Testosterone, Free, Total, SHBG  Fatty liver -     FIB-4 w/Rx NASH FibroSure Plus  Pure hypercholesterolemia Assessment & Plan: Diet: Focus on a Mediterranean-style diet, limit saturated fats and sugars, and  increase omega-3 fatty acids (fish, flaxseeds,nuts,extra virgin olive oil, avocados). Exercise: Engage in regular physical activity (aerobic exercises are particularly beneficial for HDL). Weight Management: Maintain a healthy weight. Smoking Cessation: Quitting smoking improves cholesterol levels.     Screening examination for infectious disease -     Hepatitis C antibody  Need for shingles vaccine -     Varicella-zoster vaccine IM  Primary hypertension -     Losartan Potassium; Take 1 tablet (25 mg total) by mouth daily.  Dispense: 90 tablet; Refill: 3     Getting  Answers and Following Up  Simple Questions & Concerns: For quick questions or basic follow-up after your visit, reach [place] at [phone] or MyChart messaging. Complex Concerns: If your concern is more complex, scheduling an appointment might be best. Discuss this with the staff to find the most suitable option. Lab & Imaging Results: We'll contact you directly if results are abnormal or you don't use MyChart. Most normal results will be on MyChart within 2-3 business days, with a review message from Dr. Jon Billings. Haven't heard back in 2 weeks? Need results sooner? Contact us at [phone]. Referrals: Our referral coordinator will manage specialist referrals. The specialist's office should contact you within 2 weeks to schedule an appointment. Call us if you haven't heard from them after 2 weeks.  Staying Connected  MyChart: Activate your MyChart for the fastest way to access results and message [place]. See the last page of this paperwork for instructions.  Billing  X-ray & Lab Orders: These are billed by separate companies. Contact the invoicing company directly for questions or concerns. Visit Charges: Discuss any billing inquiries with our administrative services team.  Feedback & Satisfaction  Share Your Experience: We strive for your satisfaction! If you have any complaints, please let Dr. Jon Billings know directly or contact our Practice Administrators, Edwena Felty or [employer], by asking at the front desk.  Scheduling Tips  Shorter Wait Times: 8 am and 1 pm appointments often have the quickest wait times. Longer Appointments: If you need more time during your visit, talk to the front desk. Due to insurance regulations, multiple back-to-back appointments might be necessary.

## 2023-03-17 NOTE — Assessment & Plan Note (Addendum)
Refer back to endo now that he has insurance again. Has fallen off testosterone replacement. Suspect complex endocrine disorder(s) given morbid obesity and moebius syndrome and patient reports one testicle being atrophied.

## 2023-03-18 LAB — HEPATITIS C ANTIBODY: Hepatitis C Ab: NONREACTIVE

## 2023-03-18 MED ORDER — METFORMIN HCL 500 MG PO TABS
500.0000 mg | ORAL_TABLET | Freq: Two times a day (BID) | ORAL | 3 refills | Status: DC
Start: 2023-03-18 — End: 2023-03-24

## 2023-03-18 NOTE — Addendum Note (Signed)
Addended by: Lula Olszewski on: 03/18/2023 03:36 PM   Modules accepted: Orders

## 2023-03-20 LAB — TESTOSTERONE, FREE, TOTAL, SHBG
Sex Hormone Binding: 27.4 nmol/L (ref 19.3–76.4)
Testosterone, Free: 3.1 pg/mL — ABNORMAL LOW (ref 7.2–24.0)
Testosterone: 53 ng/dL — ABNORMAL LOW (ref 264–916)

## 2023-03-21 NOTE — Progress Notes (Signed)
Testosterone is very low, we have fresh endocrinology referral pending. Lab Results      Component                Value               Date                      TESTOSTERONE             53 (L)              03/17/2023                TESTOSTERONE             99 (L)              01/18/2014                TESTOSTERONE             133 (L)             06/22/2013                TESTOSTERONE             548.34              09/16/2012                TESTOSTERONE             548.34              09/16/2012

## 2023-03-24 ENCOUNTER — Ambulatory Visit (INDEPENDENT_AMBULATORY_CARE_PROVIDER_SITE_OTHER): Payer: Medicaid Other | Admitting: Internal Medicine

## 2023-03-24 VITALS — BP 112/77 | HR 77 | Temp 97.3°F | Ht 69.0 in | Wt >= 6400 oz

## 2023-03-24 DIAGNOSIS — Z6841 Body Mass Index (BMI) 40.0 and over, adult: Secondary | ICD-10-CM

## 2023-03-24 DIAGNOSIS — Z0000 Encounter for general adult medical examination without abnormal findings: Secondary | ICD-10-CM | POA: Diagnosis not present

## 2023-03-24 DIAGNOSIS — F3177 Bipolar disorder, in partial remission, most recent episode mixed: Secondary | ICD-10-CM

## 2023-03-24 DIAGNOSIS — E291 Testicular hypofunction: Secondary | ICD-10-CM | POA: Diagnosis not present

## 2023-03-24 DIAGNOSIS — K760 Fatty (change of) liver, not elsewhere classified: Secondary | ICD-10-CM | POA: Diagnosis not present

## 2023-03-24 MED ORDER — METFORMIN HCL 500 MG PO TABS
1000.0000 mg | ORAL_TABLET | Freq: Two times a day (BID) | ORAL | 0 refills | Status: DC
Start: 2023-03-24 — End: 2023-03-24

## 2023-03-24 MED ORDER — METFORMIN HCL 500 MG PO TABS
1000.0000 mg | ORAL_TABLET | Freq: Two times a day (BID) | ORAL | 3 refills | Status: DC
Start: 2023-03-24 — End: 2024-08-16

## 2023-03-24 NOTE — Progress Notes (Signed)
[facility]ult [facility]: [phone] Provider: Lula Olszewski, MD   Chief Complaint:  Todd Roach is a 52 y.o. assigned male at birth who presents today for his annual comprehensive physical exam.    Assessment/Plan:   Todd Roach was seen today for one week follow-up.  Hypogonadism male Overview: Lab Results  Component Value Date   TESTOSTERONE 53 (L) 03/17/2023   Prior history: 04/02/2014. Todd Roach.  Todd Levy,MD. 1. Testosterone deficiency:  April 2015 labs found total testosterone very low at 101 but normal Free Testosterone 17.8. FSH was slightly elevated and Prolactin was high-normal. Todd Roach health insurance would not authorize an MRI Brain to look for pituitary pathology.  We decided that the free testosterone may be an error since he had symptoms and physical exam findings consistent with hypogonadism. Todd Roach was improved for the 1st week but reappeared in the 2nd week. Follow-up labs on 03/29/14 showed no change with total testosterone 101 and free testosterone 17.1.  One possible explanation for this discrepancy is decreased sex hormone binding globulin production.  I'll raise Todd Roach's Testosterone Cypionate to 100mg  once a week. He will repeat labs in 8 weeks including pituitary axis hormones for re evaluation on testosterone replacement.   Fatty liver Overview: From ultrasound 2018   Bipolar disorder, in partial remission, most recent episode mixed (HCC) Overview:  Following with psychiatry Todd Roach   Morbid obesity with BMI of 60.0-69.9, adult (HCC) Overview: Increased body mass noted.  Body mass index is 60.46 kg/m.  Well proportioned with no abnormal fat distribution.  Good muscle tone.  Lab Results  Component Value Date   TSH 2.35 08/12/2016   Lab Results  Component Value Date   HGBA1C 5.2 08/12/2016    Wt Readings from Last 10 Encounters:  03/17/23 (!) 409 lb 6.4 oz (185.7 kg)  01/27/17 (!) 368 lb (166.9 kg)   09/30/16 (!) 367 lb (166.5 kg)  08/12/16 (!) 370 lb (167.8 kg)  08/22/14 (!) 356 lb (161.5 kg)  08/21/14 (!) 358 lb (162.4 kg)  08/17/14 (!) 358 lb (162.4 kg)  08/14/14 (!) 359 lb 3.2 oz (162.9 kg)  08/12/14 (!) 364 lb (165.1 kg)  02/26/14 (!) 353 lb (160.1 kg)     Orders: -     metFORMIN HCl; Take 2 tablets (1,000 mg total) by mouth 2 (two) times daily with a meal.  Dispense: 360 tablet; Refill: 3     Today's Health Maintenance Counseling and Anticipatory Guidance:   Eye exams:  every 1-2 years recommended.  Having vision corrected can improve the quality of day-to-day life.  Eye specialists can detect certain eye conditions such as cataracts, glaucoma and age-related macular degeneration, which could lead to sight loss.  They can also detect certain rare cancers and diabetes, among other things.  Todd Roach reports his last eye exam was:  seen in past year or so Dental health: Discussed importance of regular tooth brushing, flossing, and dental visits q6 months.  Poor dentition can lead to serious medical problems - particularly problems with heart valves.  Todd Roach reports he has not been keeping up with his dental visits.  He intends to do so in the future.  Sinus health: Encourage sterile saline nasal misting sinus rinses daily for pollen, to reduce allergies and risk for sinus infections.   Sterile can based misting products are recommended due to superior misting and ease of maintaining sterility. Sleep Apnea screening:  He  denies any significant problems with sleep quality or  hypersomnolence or being advised that he has been told that he snores or has apnea STOP-Bang Score Do you snore loudly?: Yes Do you often feel tired, fatigued, or sleepy during the daytime?: No Has anyone observed you stop breathing during sleep?: Yes Do you have (or are you being treated for) high blood pressure?: Yes BMI    Body Mass Index: 60.22 kg/m    @ Is BMI greater than 35 kg/m2?:  Yes Age  older than 52 years old?:  Yes Has large neck size > 40 cm (15.7 in, large male shirt size, large male collar size > 16):  Yes Gender Male?:  Yes STOP-Bang Total Score(total yes answers):  6 sleep study referral already pending Cardiovascular Risk Factor Reduction:   Advised patient of need for regular exercise and diet rich and fruits and vegetables and healthy fats to reduce risk of heart attack and stroke.  Avoid first- and second-hand smoke and stimulants.   Avoid extreme exercise- exercise in moderation (150 minutes per week is a good goal) Wt Readings from Last 3 Encounters:  03/24/23 (!) 407 lb 12.8 oz (185 kg)  03/17/23 (!) 409 lb 6.4 oz (185.7 kg)  01/27/17 (!) 368 lb (166.9 kg)   Body mass index is 60.22 kg/m. /  He reports his diet consists of  a lot of starch He reports his exercise includes of none Health maintenance and immunizations reviewed and he was encouraged to complete anything that is due: Immunization History  Administered Date(s) Administered   Influenza, Seasonal, Injecte, Preservative Fre 09/16/2012   Influenza,inj,Quad PF,6+ Mos 08/22/2014, 08/12/2016, 09/08/2021   Influenza-Unspecified 09/16/2012   PFIZER Comirnaty(Gray Top)Covid-19 Tri-Sucrose Vaccine 01/05/2020, 01/30/2020   PFIZER(Purple Top)SARS-COV-2 Vaccination 01/05/2020, 01/30/2020   Tdap 08/12/2016   Zoster Recombinat (Shingrix) 03/17/2023   Health Maintenance Due  Topic Date Due   Colonoscopy  Never done   Cologuard has arrived and he plans to do. Sexual transmitted infection screening: testing offered today, but patient declined as he feels he is low risk based on his sexual history    Substance use:  I discussed that my recommendation is total abstinence from all substances of abuse including smoke and 2nd hand smoke, alcohol, illicit drugs, smoking, inhalants, sugar.   Offered to assist with any use disorders or addictions.   Injury prevention: Discussed safety belts, safety helmets, smoke  detectors. Cancer Screening: Penile cancer screening: Asked about genital warts or tumors/abnormalities of penis. Testicular cancer screening:  Patient was advised to palpate testicles, scrotum, penis for masses and inform me of any.   Thyroid cancer screening: patient advised to check by palpating thyroid for nodules Prostate cancer screening:  Denies family history of prostate cancer or hematospermia but offered and he declined today Lab Results  Component Value Date   PSA 0.50 06/22/2013   PSA 0.56 09/16/2012   PSA 0.74 03/03/2012       Colon cancer screening:    going to do Cologuard. Lung cancer screening:  current guidelines recommend Individuals aged 23 to 75 who currently smoke or formerly smoked and have a ? 20 pack-year smoking history should undergo annual screening with low-dose computed tomography (LDCT). Skin cancer screening-  Advised regular sunscreen use. He denies worrisome, changing, or new skin lesions. Showed him pictures of melanomas for reference:    Return to care in 1 year for next preventative visit.   Todd Olszewski, MD     Subjective:   See problem-oriented charting in (overview sections of assessment & plan)  for updated chart information added to chronic problems for future tracking  He has no acute complaints today.   comprehensive ros was negative today  I attest that I have reviewed and confirmed the patients current medications to meet the medication reconciliation requirement  Current Outpatient Medications  Medication Sig Dispense Refill   Armodafinil 200 MG TABS Take 1 tablet by mouth every morning.     azelastine (ASTELIN) 0.1 % nasal spray Place 2 sprays into both nostrils 2 (two) times daily. Use in each nostril as directed 30 mL 12   cetirizine (ZYRTEC) 10 MG tablet Take 10 mg by mouth daily.     fexofenadine (ALLEGRA) 60 MG tablet Take by mouth.     fluticasone (FLONASE) 50 MCG/ACT nasal spray Place 2 sprays into both nostrils daily. 16  g 6   ketoconazole (NIZORAL) 2 % shampoo APPLY TOPICALLY 2 TIMES A WEEK     lamoTRIgine (LAMICTAL) 100 MG tablet Take 2 tablets by mouth daily.     losartan (COZAAR) 25 MG tablet Take 1 tablet (25 mg total) by mouth daily. 90 tablet 3   Lurasidone HCl 60 MG TABS SMARTSIG:1 Tablet(s) By Mouth Every Evening     Multiple Vitamin (MULTIVITAMIN) tablet Take 1 tablet by mouth daily.     metFORMIN (GLUCOPHAGE) 500 MG tablet Take 2 tablets (1,000 mg total) by mouth 2 (two) times daily with a meal. 360 tablet 3   No current facility-administered medications for this visit.    The following were reviewed and entered/updated in epic:    03/17/2023   10:12 AM  Depression screen PHQ 2/9  Decreased Interest 0  Down, Depressed, Hopeless 0  PHQ - 2 Score 0   Past Medical History:  Diagnosis Date   Allergy    Patient Active Problem List   Diagnosis Date Noted   Bipolar disorder, in partial remission, most recent episode mixed (HCC) 05/30/2018   Elevated LFTs 05/30/2018   Fatty liver 05/30/2018   Pure hypercholesterolemia 10/12/2016   Testosterone deficiency 11/22/2014   Moebius syndrome 03/03/2012   Morbid obesity with BMI of 60.0-69.9, adult (HCC) 03/03/2012   Hypogonadism male 03/03/2012   History reviewed. No pertinent surgical history. Family History  Problem Relation Age of Onset   Diabetes Father    No Known Allergies Social History   Tobacco Use   Smoking status: Never   Smokeless tobacco: Never  Vaping Use   Vaping Use: Never used  Substance Use Topics   Alcohol use: Yes    Comment: Varies. Not excessive.   Drug use: No           Objective:  Physical Exam: BP 112/77 (BP Location: Right Arm, Patient Position: Sitting)   Pulse 77   Temp (!) 97.3 F (36.3 C) (Temporal)   Ht 5\' 9"  (1.753 m)   Wt (!) 407 lb 12.8 oz (185 kg)   SpO2 94%   BMI 60.22 kg/m   Body mass index is 60.22 kg/m. Wt Readings from Last 3 Encounters:  03/24/23 (!) 407 lb 12.8 oz (185 kg)   03/17/23 (!) 409 lb 6.4 oz (185.7 kg)  01/27/17 (!) 368 lb (166.9 kg)   Gen: NAD, resting comfortably HEENT: tympanic membranes normal bilaterally. oropharynx clear. No thyromegaly noted. Poor dentition CV: s1 and s2 heart sounds have regular rate and rhythm with no murmurs appreciated, quiet sounds(thick chest wall) Pulm: normal work of breathing, clear to auscultation bilaterally with no crackles, wheezes, or rhonchi GI: Soft, Nontender, Nondistended. MSK:  no edema, cyanosis, or clubbing noted Skin: warm, dry Neuro:  pupils don't respond to light, jaw hangs down, can't raise lip corners to smile,. Strength 5/5 in upper and lower extremities. Reflexes symmetric and intact bilaterally.  Psych: Normal affect and thought content

## 2023-03-31 ENCOUNTER — Ambulatory Visit: Payer: Medicaid Other | Admitting: Internal Medicine

## 2023-04-29 ENCOUNTER — Institutional Professional Consult (permissible substitution): Payer: Medicaid Other | Admitting: Neurology

## 2023-05-26 ENCOUNTER — Encounter: Payer: Self-pay | Admitting: Internal Medicine

## 2023-07-10 ENCOUNTER — Encounter (HOSPITAL_BASED_OUTPATIENT_CLINIC_OR_DEPARTMENT_OTHER): Payer: Self-pay

## 2023-07-10 ENCOUNTER — Emergency Department (HOSPITAL_BASED_OUTPATIENT_CLINIC_OR_DEPARTMENT_OTHER)
Admission: EM | Admit: 2023-07-10 | Discharge: 2023-07-10 | Disposition: A | Discharge status: Home / Self Care | Payer: Medicaid Other | Attending: Emergency Medicine | Admitting: Emergency Medicine

## 2023-07-10 ENCOUNTER — Other Ambulatory Visit: Payer: Self-pay

## 2023-07-10 DIAGNOSIS — L723 Sebaceous cyst: Secondary | ICD-10-CM | POA: Insufficient documentation

## 2023-07-10 DIAGNOSIS — Z7984 Long term (current) use of oral hypoglycemic drugs: Secondary | ICD-10-CM | POA: Diagnosis not present

## 2023-07-10 DIAGNOSIS — L0211 Cutaneous abscess of neck: Secondary | ICD-10-CM | POA: Insufficient documentation

## 2023-07-10 MED ORDER — DOXYCYCLINE HYCLATE 100 MG PO CAPS: 100.0000 mg | ORAL_CAPSULE | Freq: Two times a day (BID) | ORAL | 0 refills | Status: DC

## 2023-07-10 MED ORDER — LIDOCAINE HCL (PF) 1 % IJ SOLN
10.0000 mL | Freq: Once | INTRAMUSCULAR | Status: AC
  Administered 2023-07-10: 10 mL
  Filled 2023-07-10: qty 10

## 2023-07-10 NOTE — ED Triage Notes (Signed)
In for eval of cyst to posterior neck, worsened Wednesday.

## 2023-07-10 NOTE — ED Notes (Signed)
Dc instructions reviewed with patient. Patient voiced understanding. Dc with belongings.  °

## 2023-07-10 NOTE — ED Provider Notes (Signed)
[facility] Provider Note   CSN: [id number] Arrival date & time: 07/10/23  4098     History Chief Complaint  Patient presents with   Abscess    Todd Roach is a 52 y.o. 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 No drainage. The patient has had inflamed cysts to his back, but not his neck before. Denies any fever or neck stiffness.  He denies any allergies to any medications.   Abscess Associated symptoms: no fever and no headaches        Home Medications Prior to Admission medications   Medication Sig Start Date End Date Taking? Authorizing Provider  doxycycline (VIBRAMYCIN) 100 MG capsule Take 1 capsule (100 mg total) by mouth 2 (two) times daily. 07/10/23  Yes Achille Rich, PA-C  Armodafinil 200 MG TABS Take 1 tablet by mouth every morning. 03/03/23   [provider]  azelastine (ASTELIN) 0.1 % nasal spray Place 2 sprays into both nostrils 2 (two) times daily. Use in each nostril as directed 01/29/17   Ethelda Chick, MD  cetirizine (ZYRTEC) 10 MG tablet Take 10 mg by mouth daily.    [provider]  fexofenadine (ALLEGRA) 60 MG tablet Take by mouth.    [provider]  fluticasone (FLONASE) 50 MCG/ACT nasal spray Place 2 sprays into both nostrils daily. 01/27/17   Ethelda Chick, MD  ketoconazole (NIZORAL) 2 % shampoo APPLY TOPICALLY 2 TIMES A WEEK 05/12/22   [provider]  lamoTRIgine (LAMICTAL) 100 MG tablet Take 2 tablets by mouth daily. 02/14/20   [provider]  losartan (COZAAR) 25 MG tablet Take 1 tablet (25 mg total) by mouth daily. 03/17/23   Lula Olszewski, MD  Lurasidone HCl 60 MG TABS SMARTSIG:1 Tablet(s) By Mouth Every Evening 01/24/23   [provider]  metFORMIN (GLUCOPHAGE) 500 MG tablet Take 2 tablets (1,000 mg total) by mouth 2 (two) times daily with a meal.  03/24/23 03/18/24  Lula Olszewski, MD  Multiple Vitamin (MULTIVITAMIN) tablet Take 1 tablet by mouth daily.    [provider]      Allergies    Patient has no known allergies.    Review of Systems   Review of Systems  Constitutional:  Negative for chills and fever.  Respiratory:  Negative for shortness of breath.   Cardiovascular:  Negative for chest pain.  Musculoskeletal:  Negative for neck pain and neck stiffness.  Skin:        Reports abscess  Neurological:  Negative for headaches.    Physical Exam Updated Vital Signs BP 112/66   Pulse 86   Temp 98.4 F (36.9 C) (Oral)   Resp 17   Ht 5\' 10"  (1.778 m)   Wt (!) 176.9 kg   SpO2 94%   BMI 55.96 kg/m  Physical Exam Vitals and nursing note reviewed.  Constitutional:      General: He is not in acute distress.    Appearance: He is not ill-appearing or toxic-appearing.  Eyes:     General: No scleral icterus. Neck:     Comments: Approximately 4 cm x 3 cm fluctuant raised area to the nape of the neck.  There is some surrounding erythema and warmth however no red streaking noted.  Nonpulsatile. Pulmonary:     Effort: Pulmonary effort is normal. No respiratory distress.  Musculoskeletal:  Cervical back: Normal range of motion.  Skin:    General: Skin is warm and dry.  Neurological:     Mental Status: He is alert. Mental status is at baseline.        ED Results / Procedures / Treatments   Labs (all labs ordered are listed, but only abnormal results are displayed) Labs Reviewed - No data to display  EKG None  Radiology No results found.  Procedures .Marland KitchenIncision and Drainage  Date/Time: 07/10/2023 12:46 PM  Performed by: Achille Rich, PA-C Authorized by: Achille Rich, PA-C   Consent:    Consent obtained:  Verbal   Consent given by:  Patient   Risks, benefits, and alternatives were discussed: yes     Risks discussed:  Bleeding, incomplete drainage, pain and infection   Alternatives discussed:   No treatment Universal protocol:    Procedure explained and questions answered to patient or proxy's satisfaction: yes     Patient identity confirmed:  Verbally with patient Location:    Type:  Abscess   Size:  5cm   Location:  Neck   Neck location: Midline posterior. Pre-procedure details:    Skin preparation:  Betadine Sedation:    Sedation type:  None Anesthesia:    Anesthesia method:  Local infiltration   Local anesthetic:  Lidocaine 1% w/o epi Procedure type:    Complexity:  Complex Procedure details:    Ultrasound guidance: yes     Incision types:  Single straight   Incision depth:  Subcutaneous   Wound management:  Probed and deloculated, irrigated with saline and extensive cleaning   Drainage:  Purulent and bloody   Drainage amount:  Moderate   Wound treatment:  Wound left open Post-procedure details:    Procedure completion:  Tolerated well, no immediate complications    Medications Ordered in ED Medications  lidocaine (PF) (XYLOCAINE) 1 % injection 10 mL (10 mLs Other Given by Other 07/10/23 1023)    ED Course/ Medical Decision Making/ A&P                             Medical Decision Making Risk Prescription drug management.   52 y.o. male presents to the ER today for evaluation of abscess. Differential diagnosis includes but is not limited to cyst, inflamed cyst, abscess, mass. Vital signs show mildly elevated BP, otherwise unremarkable. Physical exam as noted above.   The patient has had multiple cysts before in the past. Currently does not have a dermatologist.  I have given them information for 1 to follow-up with.  I ultrasounded the area at bedside.  Appears to be to be well-contained with some cystic like products.  Nonpulsatile.  Given the presentation as well as after the drainage is likely an inflamed cyst given the presence of a cystic like products in the area as well as the thin purulent substances.  He tolerated procedure well reports that he is  feeling much better.  I will discharge home on some antibiotics for coverage.  We discussed plan at bedside. We discussed strict return precautions and red flag symptoms. The patient verbalized their understanding and agrees to the plan. The patient is stable and being discharged home in good condition.  Portions of this report may have been transcribed using voice recognition software. Every effort was made to ensure accuracy; however, inadvertent computerized transcription errors may be present.   Final Clinical Impression(s) / ED Diagnoses Final diagnoses:  Inflamed sebaceous cyst  Rx / DC Orders ED Discharge Orders          Ordered    doxycycline (VIBRAMYCIN) 100 MG capsule  2 times daily        07/10/23 1216              Achille Rich, PA-C 07/10/23 1949    Melene Plan, DO 07/11/23 [phone]

## 2023-07-10 NOTE — Discharge Instructions (Addendum)
You were seen in the ER for evaluation of your inflammed cyst. I am glad we were able to drain this for you today. Ultimately, this may come back and you will need ot see a dermatologist about getting it removed. I have included the information for one in the discharge paperwork.  Please make sure you call to schedule an appointment.  In 24 hours, you need to bandage off.  Please ensure you are cleaning it daily with Dial soap and water.  I have also sent you home with an antibiotic to take twice a day for the next week. Take tylenol or ibuprofen as needed for pain. If you have any worsening redness, fever, swelling, soaking bandages, please return to your nearest emergency room for evaluation.  If you have any concerns, new or worsening symptoms, please return to your nearest emergency department for reevaluation.  Contact a health care provider if: Your cyst or abscess comes back. You have any signs of infection. You notice red streaks that spread away from the incision site. You have a fever or chills. Get help right away if: You have severe pain or bleeding. You become short of breath. You have chest pain. You have signs of a severe infection. You may notice changes in your incision area, such as: Swelling that makes the skin feel hard. Numbness or tingling. Sudden increase in redness. Your skin color may change from red to purple, and then to dark spots. Blisters, ulcers, or splitting of the skin. These symptoms may be an emergency. Get help right away. Call 911. Do not wait to see if the symptoms will go away. Do not drive yourself to the hospital.

## 2023-07-10 NOTE — ED Notes (Signed)
I&D supplies at bedside

## 2023-08-19 ENCOUNTER — Ambulatory Visit: Payer: Medicaid Other | Admitting: Family

## 2023-08-19 VITALS — BP 114/76 | HR 74 | Temp 98.0°F | Ht 70.0 in | Wt >= 6400 oz

## 2023-08-19 DIAGNOSIS — R339 Retention of urine, unspecified: Secondary | ICD-10-CM | POA: Diagnosis not present

## 2023-08-19 LAB — POCT URINALYSIS DIPSTICK
Bilirubin, UA: NEGATIVE
Blood, UA: NEGATIVE
Glucose, UA: NEGATIVE
Ketones, UA: NEGATIVE
Leukocytes, UA: NEGATIVE
Nitrite, UA: NEGATIVE
Protein, UA: NEGATIVE
Spec Grav, UA: 1.02 (ref 1.010–1.025)
Urobilinogen, UA: 0.2 U/dL
pH, UA: 6 (ref 5.0–8.0)

## 2023-08-19 NOTE — Progress Notes (Signed)
Patient ID: Todd Roach, male    DOB: 1970-11-11, 52 y.o.   MRN: [id number]  Chief Complaint  Patient presents with   Urinary Retention    Pt c/o urinary retention since yesterday, pt denies pain.    Discussed the use of AI scribe software for clinical note transcription with the patient, who gave verbal consent to proceed.  History of Present Illness   The patient, with a history of Mobius syndrome, presents with urinary retention that began yesterday morning. He was unable to urinate much throughout the day, but noticed improvement after attempting to flush his system with water. He admits to not being well-hydrated normally and consumes a significant amount of coffee. He experienced a similar episode about a year ago, which resolved on its own after a day or so. He denies any new medications, genital irritation, or previous bladder infections.     Assessment & Plan:     Urinary Retention - UA negative. Self-resolved episode of urinary retention. No signs of infection or dehydration. History of similar episode approximately a year ago. High intake of caffeinated beverages. -Encourage increased water intake to counteract the dehydrating effects of caffeine, at least 2L per day. -Continue to monitor symptoms and seek medical attention if symptoms recur or worsen.      Subjective:    Outpatient Medications Prior to Visit  Medication Sig Dispense Refill   Armodafinil 200 MG TABS Take 1 tablet by mouth every morning.     azelastine (ASTELIN) 0.1 % nasal spray Place 2 sprays into both nostrils 2 (two) times daily. Use in each nostril as directed 30 mL 12   cetirizine (ZYRTEC) 10 MG tablet Take 10 mg by mouth daily.     fexofenadine (ALLEGRA) 60 MG tablet Take by mouth.     fluticasone (FLONASE) 50 MCG/ACT nasal spray Place 2 sprays into both nostrils daily. 16 g 6   ketoconazole (NIZORAL) 2 % shampoo APPLY TOPICALLY 2 TIMES A WEEK     lamoTRIgine (LAMICTAL) 100 MG tablet Take 2 tablets by  mouth daily.     losartan (COZAAR) 25 MG tablet Take 1 tablet (25 mg total) by mouth daily. 90 tablet 3   Lurasidone HCl 60 MG TABS SMARTSIG:1 Tablet(s) By Mouth Every Evening     metFORMIN (GLUCOPHAGE) 500 MG tablet Take 2 tablets (1,000 mg total) by mouth 2 (two) times daily with a meal. 360 tablet 3   Multiple Vitamin (MULTIVITAMIN) tablet Take 1 tablet by mouth daily.     doxycycline (VIBRAMYCIN) 100 MG capsule Take 1 capsule (100 mg total) by mouth 2 (two) times daily. (Patient not taking: Reported on 08/19/2023) 14 capsule 0   No facility-administered medications prior to visit.   Past Medical History:  Diagnosis Date   Allergy    No past surgical history on file. No Known Allergies    Objective:    Physical Exam Vitals and nursing note reviewed.  Constitutional:      General: He is not in acute distress.    Appearance: Normal appearance. He is morbidly obese.  HENT:     Head: Normocephalic.  Cardiovascular:     Rate and Rhythm: Normal rate and regular rhythm.  Pulmonary:     Effort: Pulmonary effort is normal.     Breath sounds: Normal breath sounds.  Musculoskeletal:        General: Normal range of motion.     Cervical back: Normal range of motion.  Skin:    General: Skin is  warm and dry.  Neurological:     Mental Status: He is alert and oriented to person, place, and time.  Psychiatric:        Mood and Affect: Mood normal.    Temp 98 F (36.7 C) (Temporal)   Ht 5\' 10"  (1.778 m)   Wt (!) 413 lb 3.2 oz (187.4 kg)   BMI 59.29 kg/m  Wt Readings from Last 3 Encounters:  08/19/23 (!) 413 lb 3.2 oz (187.4 kg)  07/10/23 (!) 390 lb (176.9 kg)  03/24/23 (!) 407 lb 12.8 oz (185 kg)      Dulce Sellar, NP

## 2024-06-19 DIAGNOSIS — F3132 Bipolar disorder, current episode depressed, moderate: Secondary | ICD-10-CM | POA: Diagnosis not present

## 2024-07-05 ENCOUNTER — Encounter: Admitting: Internal Medicine

## 2024-07-26 DIAGNOSIS — F3132 Bipolar disorder, current episode depressed, moderate: Secondary | ICD-10-CM | POA: Diagnosis not present

## 2024-08-16 ENCOUNTER — Encounter: Payer: Self-pay | Admitting: Internal Medicine

## 2024-08-16 ENCOUNTER — Ambulatory Visit (INDEPENDENT_AMBULATORY_CARE_PROVIDER_SITE_OTHER): Admitting: Internal Medicine

## 2024-08-16 VITALS — BP 138/88 | HR 88 | Temp 98.0°F | Ht 70.0 in | Wt >= 6400 oz

## 2024-08-16 DIAGNOSIS — K760 Fatty (change of) liver, not elsewhere classified: Secondary | ICD-10-CM | POA: Diagnosis not present

## 2024-08-16 DIAGNOSIS — R29818 Other symptoms and signs involving the nervous system: Secondary | ICD-10-CM | POA: Insufficient documentation

## 2024-08-16 MED ORDER — WEGOVY 0.25 MG/0.5ML ~~LOC~~ SOAJ: 0.2500 mg | SUBCUTANEOUS | 2 refills | Status: DC

## 2024-08-16 NOTE — Patient Instructions (Signed)
 It was a pleasure seeing you today! Your health and satisfaction are our top priorities.  Bernardino Cone, MD  VISIT SUMMARY: Today, you visited to discuss concerns about sleep apnea and your overall health. We reviewed your symptoms, current medications, and weight loss progress. We also discussed the potential impact of your Mobius syndrome on sleep apnea treatment and explored various treatment options. Additionally, we addressed your morbid obesity, fatty liver disease, and general health maintenance.  YOUR PLAN: -MORBID OBESITY: You have morbid obesity with a BMI of 58.28. We discussed the importance of focusing on fat loss rather than just weight loss to improve your health. You will continue taking Vyvanse for weight management and have been prescribed Zepbound to aid in weight loss and potentially improve your sleep apnea. Dietary changes, such as eliminating sugar in beverages, reducing carbohydrate intake, and considering intermittent fasting, were recommended. Regular physical activity is also encouraged to help preserve muscle mass during weight loss.  -SUSPECTED SLEEP APNEA: You exhibit symptoms of sleep apnea, such as snoring, waking up gasping for air, and daytime drowsiness. Sleep apnea is a condition where your breathing repeatedly stops and starts during sleep. Due to your Mobius syndrome, using a CPAP mask may be challenging. We discussed the potential benefits of Zepbound in reducing airway obstruction by decreasing fat tissue. A sleep study is needed to confirm the diagnosis and assess the severity of your sleep apnea. We also discussed alternative treatments, including CPAP, bariatric surgery, and implantable devices. An urgent sleep study has been ordered, and Zepbound has been prescribed pending insurance approval and sleep study results.  -FATTY LIVER DISEASE: You have fatty liver disease, which means there is excess fat in your liver. We discussed the potential benefits of GLP-1  agonists like Zepbound in treating this condition. Georjean was considered as an alternative treatment option. Blood work has been ordered to assess your liver function and determine your eligibility for [facility]. Georjean has been prescribed for your fatty liver disease pending insurance approval.  -GENERAL HEALTH MAINTENANCE: We discussed the importance of maintaining your Medicaid coverage to ensure continued access to healthcare services. Regular follow-up visits are needed to monitor your health status and adjust treatment plans as necessary.  INSTRUCTIONS: Please schedule an urgent sleep study to confirm the diagnosis of sleep apnea and assess its severity. Continue taking Vyvanse for weight management and start Zepbound as prescribed, pending insurance approval. Follow the recommended dietary changes and engage in regular physical activity. Complete the blood work to assess your liver function and determine eligibility for [facility]. Maintain your Medicaid coverage and schedule regular follow-up visits to monitor your health and adjust treatment plans as needed.  Your Providers PCP: Cone Bernardino MATSU, MD,  [phone]) Referring Provider: Cone Bernardino MATSU, MD,  [phone]) Care Team Provider: [facility], [place],  [phone]) Care Team Provider: Lavoie, Valerie, NP,  [phone])  NEXT STEPS: [x]  Early Intervention: Schedule sooner appointment, call our on-call services, or go to emergency room if there is any significant Increase in pain or discomfort New or worsening symptoms Sudden or severe changes in your health [x]  Flexible Follow-Up: We recommend a No follow-ups on file. for optimal routine care. This allows for progress monitoring and treatment adjustments. [x]  Preventive Care: Schedule your annual preventive care visit! It's typically covered by insurance and helps identify potential health issues early. [x]  Lab & X-ray Appointments: Incomplete tests  scheduled today, or call to schedule. X-rays: [facility] (M-F, 8:30am-noon or 1pm-5pm). [x]   Medical Information Release: Sign a release form at front desk to obtain relevant medical information we don't have.  MAKING THE MOST OF OUR FOCUSED 20 MINUTE APPOINTMENTS: [x]   Clearly state your top concerns at the beginning of the visit to focus our discussion [x]   If you anticipate you will need more time, please inform the front desk during scheduling - we can book multiple appointments in the same week. [x]   If you have transportation problems- use our convenient video appointments or ask about transportation support. [x]   We can get down to business faster if you use MyChart to update information before the visit and submit non-urgent questions before your visit. Thank you for taking the time to provide details through MyChart.  Let our nurse know and she can import this information into your encounter documents.  Arrival and Wait Times: [x]   Arriving on time ensures that everyone receives prompt attention. [x]   Early morning (8a) and afternoon (1p) appointments tend to have shortest wait times. [x]   Unfortunately, we cannot delay appointments for late arrivals or hold slots during phone calls.  Getting Answers and Following Up [x]   Simple Questions & Concerns: For quick questions or basic follow-up after your visit, reach us  at [phone] or MyChart messaging. [x]   Complex Concerns: If your concern is more complex, scheduling an appointment might be best. Discuss this with the staff to find the most suitable option. [x]   Lab & Imaging Results: We'll contact you directly if results are abnormal or you don't use MyChart. Most normal results will be on MyChart within 2-3 business days, with a review message from Dr. Jesus. Haven't heard back in 2 weeks? Need results sooner? Contact us  at [phone]. [x]   Referrals: Our referral coordinator will manage specialist referrals.  The specialist's office should contact you within 2 weeks to schedule an appointment. Call us  if you haven't heard from them after 2 weeks.  Staying Connected [x]   MyChart: Activate your MyChart for the fastest way to access results and message us . See the last page of this paperwork for instructions on how to activate.  Bring to Your Next Appointment [x]   Medications: Please bring all your medication bottles to your next appointment to ensure we have an accurate record of your prescriptions. [x]   Health Diaries: If you're monitoring any health conditions at home, keeping a diary of your readings can be very helpful for discussions at your next appointment.  Billing [x]   X-ray & Lab Orders: These are billed by separate companies. Contact the invoicing company directly for questions or concerns. [x]   Visit Charges: Discuss any billing inquiries with our administrative services team.  Your Satisfaction Matters [x]   Share Your Experience: We strive for your satisfaction! If you have any complaints, or preferably compliments, please let Dr. Jesus know directly or contact our Practice Administrators, Manuelita Rubin or [employer], by asking at the front desk.   Reviewing Your Records [x]   Review this early draft of your clinical encounter notes below and the final encounter summary tomorrow on MyChart after its been completed.  All orders placed so far are visible here: Suspected sleep apnea -     Ambulatory referral to Sleep Studies -     Wegovy; Inject 0.25 mg into the skin once a week.  Dispense: 2 mL; Refill: 2  Fatty liver -     FIB-4 W/REFLEX TO ELF -     Microalbumin / creatinine urine ratio  Morbid obesity (HCC) -  CBC with Differential/Platelet -     Comprehensive metabolic panel with GFR -     Lipid panel -     Hemoglobin A1c -     Microalbumin / creatinine urine ratio

## 2024-08-16 NOTE — Assessment & Plan Note (Signed)
 Suspected sleep apnea   He exhibits symptoms of daytime drowsiness, snoring, and waking up gasping for air. The anatomical basis of obstructive sleep apnea, particularly in the context of Moebius syndrome, which may complicate CPAP use, was discussed. The potential benefits of GLP-1 agonists like Zepbound in reducing airway obstruction by decreasing fat tissue were explained. A sleep study is needed to confirm diagnosis and assess severity, as insurance requires evidence of oxygen desaturation 15 times per night for coverage of Zepbound. Alternative treatments including CPAP, bariatric surgery, and implantable devices were discussed, though insurance coverage is uncertain. An urgent sleep study was ordered to assess for sleep apnea and confirm oxygen desaturation. Prescribed Zepbound pending insurance approval and sleep study results. Discussed potential use of CPAP, considering Moebius syndrome, and explored alternative treatments such as bariatric surgery if necessary.

## 2024-08-16 NOTE — Progress Notes (Signed)
 ==============================  [facility]: [phone]   -- Medical Office Visit --  Patient: Todd Roach      Age: 53 y.o.       Sex:  male  Date:   08/16/2024 Today's Healthcare Provider: Bernardino KANDICE Cone, MD  ==============================   Chief Complaint: Annual Exam (Pt is present for cpe ) and Insomnia  Discussed the use of AI scribe software for clinical note transcription with the patient, who gave verbal consent to proceed.  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 He is accompanied by Todd Roach, who appears to be a caregiver or family member.  He experiences symptoms of sleep apnea, including snoring, waking up gasping for air, and daytime drowsiness. Due to Mobius syndrome, he cannot close his mouth, which raises concerns about the effectiveness of a CPAP mask. He has not yet undergone a sleep study.  He is currently taking Vyvanse, which he started within the last month, and has experienced a weight decrease from 413 to 406 pounds. He was previously on armodafinil. He is not taking losartan , despite a previous prescription.  His BMI is 58.28. He has a history of fatty liver disease, but there is no mention of current treatment for this condition. He is not currently on metformin  or topiramate for weight loss.  Wt Readings from Last 50 Encounters:  08/16/24 (!) 406 lb 3.2 oz (184.3 kg)  08/19/23 (!) 413 lb 3.2 oz (187.4 kg)  07/10/23 (!) 390 lb (176.9 kg)  03/24/23 (!) 407 lb 12.8 oz (185 kg)  03/17/23 (!) 409 lb 6.4 oz (185.7 kg)  01/27/17 (!) 368 lb (166.9 kg)  09/30/16 (!) 367 lb (166.5 kg)  08/12/16 (!) 370 lb (167.8 kg)  08/22/14 (!) 356 lb (161.5 kg)  08/21/14 (!) 358 lb (162.4 kg)  08/17/14 (!) 358 lb (162.4 kg)  08/14/14 (!) 359 lb 3.2 oz (162.9 kg)  08/12/14 (!) 364 lb (165.1 kg)  02/26/14 (!) 353 lb (160.1 kg)  01/18/14 (!) 356 lb 12.8 oz (161.8 kg)   06/08/13 (!) 345 lb (156.5 kg)  09/16/12 (!) 347 lb (157.4 kg)  03/03/12 (!) 347 lb 12.8 oz (157.8 kg)   BMI Readings from Last 50 Encounters:  08/16/24 58.28 kg/m  08/19/23 59.29 kg/m  07/10/23 55.96 kg/m  03/24/23 60.22 kg/m  03/17/23 60.46 kg/m  01/27/17 54.34 kg/m  09/30/16 54.20 kg/m  08/12/16 54.64 kg/m  08/22/14 52.57 kg/m  08/21/14 51.37 kg/m  08/17/14 51.37 kg/m  08/14/14 53.04 kg/m  08/12/14 47.37 kg/m  02/26/14 52.13 kg/m  01/18/14 52.69 kg/m  06/08/13 50.95 kg/m  09/16/12 50.51 kg/m  03/03/12 50.62 kg/m  Background Reviewed: Problem List: has Moebius syndrome; Morbid obesity with BMI of 60.0-69.9, adult (HCC); Hypogonadism male; Testosterone  deficiency; Pure hypercholesterolemia; Bipolar disorder, in partial remission, most recent episode mixed (HCC); Elevated LFTs; Fatty liver; and Suspected sleep apnea on their problem list. Past Medical History:  has a past medical history of Allergy. Past Surgical History:   has no past surgical history on file. Social History:   reports that he has never smoked. He has never used smokeless tobacco. He reports that he does not currently use alcohol. He reports that he does not use drugs. Family History:  family history includes Diabetes in his father. Allergies:  has no known allergies.   Medication Reconciliation: Current Outpatient Medications on File Prior to Visit  Medication Sig   VYVANSE 50  MG capsule Take 50 mg by mouth daily.   azelastine  (ASTELIN ) 0.1 % nasal spray Place 2 sprays into both nostrils 2 (two) times daily. Use in each nostril as directed   cetirizine (ZYRTEC) 10 MG tablet Take 10 mg by mouth daily.   fexofenadine (ALLEGRA) 60 MG tablet Take by mouth.   fluticasone  (FLONASE ) 50 MCG/ACT nasal spray Place 2 sprays into both nostrils daily.   ketoconazole (NIZORAL) 2 % shampoo APPLY TOPICALLY 2 TIMES A WEEK   lamoTRIgine (LAMICTAL) 100 MG tablet Take 2 tablets by mouth daily.   Lurasidone  HCl 60 MG TABS SMARTSIG:1 Tablet(s) By Mouth Every Evening   Multiple Vitamin (MULTIVITAMIN) tablet Take 1 tablet by mouth daily.   No current facility-administered medications on file prior to visit.   Medications Discontinued During This Encounter  Medication Reason   Armodafinil 200 MG TABS    losartan  (COZAAR ) 25 MG tablet    metFORMIN  (GLUCOPHAGE ) 500 MG tablet    doxycycline  (VIBRAMYCIN ) 100 MG capsule     Physical Exam:    08/16/2024    2:59 PM 08/19/2023    1:57 PM 07/10/2023   10:15 AM  Vitals with BMI  Height 5' 10 5' 10   Weight 406 lbs 3 oz 413 lbs 3 oz   BMI 58.28 59.29   Systolic 138 114 887  Diastolic 88 76 66  Pulse 88 74 86  Vital signs reviewed.  Nursing notes reviewed. Weight trend reviewed. Physical Activity: Insufficiently Active (08/15/2024)   Exercise Vital Sign    Days of Exercise per Week: 2 days    Minutes of Exercise per Session: 30 min   General Appearance:  No acute distress appreciable.   Well-groomed, healthy-appearing male.  Well proportioned with no abnormal fat distribution.  Good muscle tone. Pulmonary:  Normal work of breathing at rest, no respiratory distress apparent. SpO2: 98 %  Musculoskeletal: All extremities are intact.  Neurological:  Awake, alert, oriented, and engaged.  No obvious focal neurological deficits or cognitive impairments.  Sensorium seems unclouded.   Speech is clear and coherent with logical content. Psychiatric:  Appropriate mood, pleasant and cooperative demeanor, thoughtful and engaged during the exam  Verbalized to patient: Physical Exam MEASUREMENTS: Weight- 406, BMI- 58.28.      03/17/2023   10:12 AM 01/27/2017    3:57 PM 09/30/2016   10:33 AM 08/12/2016    3:03 PM  PHQ 2/9 Scores  PHQ - 2 Score 0 0 0 0   Office Visit on 08/19/2023  Component Date Value Ref Range Status   Color, UA 08/19/2023 yellow   Final   Clarity, UA 08/19/2023 clear   Final   Glucose, UA 08/19/2023 Negative  Negative Final    Bilirubin, UA 08/19/2023 Negative   Final   Ketones, UA 08/19/2023 Negative   Final   Spec Grav, UA 08/19/2023 1.020  1.010 - 1.025 Final   Blood, UA 08/19/2023 Negative   Final   pH, UA 08/19/2023 6.0  5.0 - 8.0 Final   Protein, UA 08/19/2023 Negative  Negative Final   Urobilinogen, UA 08/19/2023 0.2  0.2 or 1.0 E.U./dL Final   Nitrite, UA 88/92/7975 Negative   Final   Leukocytes, UA 08/19/2023 Negative  Negative Final  Office Visit on 03/17/2023  Component Date Value Ref Range Status   WBC 03/17/2023 6.4  4.0 - 10.5 K/uL Final   RBC 03/17/2023 4.70  4.22 - 5.81 Mil/uL Final   Hemoglobin 03/17/2023 14.2  13.0 - 17.0 g/dL Final  HCT 03/17/2023 43.1  39.0 - 52.0 % Final   MCV 03/17/2023 91.6  78.0 - 100.0 fl Final   MCHC 03/17/2023 33.0  30.0 - 36.0 g/dL Final   RDW 93/94/7975 13.9  11.5 - 15.5 % Final   Platelets 03/17/2023 152.0  150.0 - 400.0 K/uL Final   Neutrophils Relative % 03/17/2023 64.8  43.0 - 77.0 % Final   Lymphocytes Relative 03/17/2023 23.9  12.0 - 46.0 % Final   Monocytes Relative 03/17/2023 9.7  3.0 - 12.0 % Final   Eosinophils Relative 03/17/2023 1.2  0.0 - 5.0 % Final   Basophils Relative 03/17/2023 0.4  0.0 - 3.0 % Final   Neutro Abs 03/17/2023 4.2  1.4 - 7.7 K/uL Final   Lymphs Abs 03/17/2023 1.5  0.7 - 4.0 K/uL Final   Monocytes Absolute 03/17/2023 0.6  0.1 - 1.0 K/uL Final   Eosinophils Absolute 03/17/2023 0.1  0.0 - 0.7 K/uL Final   Basophils Absolute 03/17/2023 0.0  0.0 - 0.1 K/uL Final   Sodium 03/17/2023 139  135 - 145 mEq/L Final   Potassium 03/17/2023 4.3  3.5 - 5.1 mEq/L Final   Chloride 03/17/2023 99  96 - 112 mEq/L Final   CO2 03/17/2023 26  19 - 32 mEq/L Final   Glucose, Bld 03/17/2023 101 (H)  70 - 99 mg/dL Final   BUN 93/94/7975 10  6 - 23 mg/dL Final   Creatinine, Ser 03/17/2023 0.70  0.40 - 1.50 mg/dL Final   Total Bilirubin 03/17/2023 0.4  0.2 - 1.2 mg/dL Final   Alkaline Phosphatase 03/17/2023 83  39 - 117 U/L Final   AST 03/17/2023 33  0 -  37 U/L Final   ALT 03/17/2023 65 (H)  0 - 53 U/L Final   Total Protein 03/17/2023 7.5  6.0 - 8.3 g/dL Final   Albumin 93/94/7975 4.1  3.5 - 5.2 g/dL Final   GFR 93/94/7975 106.38  >60.00 mL/min Final   Calcium 03/17/2023 9.0  8.4 - 10.5 mg/dL Final   Cholesterol 93/94/7975 182  0 - 200 mg/dL Final   Triglycerides 93/94/7975 146.0  0.0 - 149.0 mg/dL Final   HDL 93/94/7975 47.00  >39.00 mg/dL Final   VLDL 93/94/7975 29.2  0.0 - 40.0 mg/dL Final   LDL Cholesterol 03/17/2023 106 (H)  0 - 99 mg/dL Final   Total CHOL/HDL Ratio 03/17/2023 4   Final   NonHDL 03/17/2023 135.29   Final   TSH 03/17/2023 2.84  0.35 - 5.50 uIU/mL Final   Hgb A1c MFr Bld 03/17/2023 5.6  4.6 - 6.5 % Final   Testosterone  03/17/2023 53 (L)  264 - 916 ng/dL Final   Testosterone , Free 03/17/2023 3.1 (L)  7.2 - 24.0 pg/mL Final   Sex Hormone Binding 03/17/2023 27.4  19.3 - 76.4 nmol/L Final   Hepatitis C Ab 03/17/2023 NON-REACTIVE  NON-REACTIVE Final  No image results found. No results found.       ASSESSMENT & PLAN   Assessment & Plan Suspected sleep apnea Suspected sleep apnea   He exhibits symptoms of daytime drowsiness, snoring, and waking up gasping for air. The anatomical basis of obstructive sleep apnea, particularly in the context of Moebius syndrome, which may complicate CPAP use, was discussed. The potential benefits of GLP-1 agonists like Zepbound in reducing airway obstruction by decreasing fat tissue were explained. A sleep study is needed to confirm diagnosis and assess severity, as insurance requires evidence of oxygen desaturation 15 times per night for coverage of Zepbound. Alternative  treatments including CPAP, bariatric surgery, and implantable devices were discussed, though insurance coverage is uncertain. An urgent sleep study was ordered to assess for sleep apnea and confirm oxygen desaturation. Prescribed Zepbound pending insurance approval and sleep study results. Discussed potential use of CPAP,  considering Moebius syndrome, and explored alternative treatments such as bariatric surgery if necessary. Fatty liver  Fatty liver disease   Fatty liver disease is present. The potential benefits of GLP-1 agonists in treating fatty liver disease were discussed, though insurance coverage is uncertain. Georjean was considered as an alternative treatment option pending further evaluation. Blood work was ordered to assess liver function and potential eligibility for [employer]. Prescribed Wegovy for fatty liver disease pending insurance approval. Morbid obesity (HCC) Morbid obesity, BMI 50-59.9, adult   He has morbid obesity with a BMI of 58.28 and has recently lost weight from 413 lbs to 406 lbs with Vyvanse. The role of insulin in fat loss and the importance of reducing carbohydrate intake to minimize insulin spikes were discussed. Emphasis was placed on fat loss rather than overall weight loss to improve health outcomes. The potential for lifelong medication use to maintain weight loss due to evolutionary programming to store fat was also discussed. Continue Vyvanse for weight management. Prescribed Zepbound for weight loss and potential improvement in sleep apnea. Advised dietary modifications: eliminate sugar in beverages, reduce carbohydrate intake, and consider intermittent fasting. Encouraged regular physical activity to preserve muscle mass during weight loss.  General Health Maintenance   The importance of maintaining Medicaid coverage for continued access to healthcare services was discussed. Regular follow-up is needed to monitor health status and adjust treatment plans as necessary. Encouraged regular follow-up visits to monitor health status and adjust treatment plans. Advised on maintaining Medicaid coverage to ensure access to healthcare services.  Recording duration: 32 minutes  ORDER ASSOCIATIONS  #   DIAGNOSIS / CONDITION ICD-10 ENCOUNTER ORDER     ICD-10-CM   1. Suspected sleep apnea   R29.818 Ambulatory referral to Sleep Studies    semaglutide-weight management (WEGOVY) 0.25 MG/0.5ML SOAJ SQ injection    2. Fatty liver  K76.0 FIB-4 W/REFLEX TO ELF    Microalbumin / creatinine urine ratio    3. Morbid obesity (HCC)  E66.01 CBC with Differential/Platelet    Comprehensive metabolic panel with GFR    Lipid panel    Hemoglobin A1c    Microalbumin / creatinine urine ratio          Orders Placed in Encounter:   Lab Orders         FIB-4 W/REFLEX TO ELF         CBC with Differential/Platelet         Comprehensive metabolic panel with GFR         Lipid panel         Hemoglobin A1c         Microalbumin / creatinine urine ratio    Referral Orders         Ambulatory referral to Sleep Studies    Meds ordered this encounter  Medications   semaglutide-weight management (WEGOVY) 0.25 MG/0.5ML SOAJ SQ injection    Sig: Inject 0.25 mg into the skin once a week.    Dispense:  2 mL    Refill:  2         This document was synthesized by artificial intelligence ([employer]) using HIPAA-compliant recording of the clinical interaction;   We discussed the use of AI scribe software for clinical note transcription with  the patient, who gave verbal consent to proceed. additional Info: This encounter employed state-of-the-art, real-time, collaborative documentation. The patient actively reviewed and assisted in updating their electronic medical record on a shared screen, ensuring transparency and facilitating joint problem-solving for the problem list, overview, and plan. This approach promotes accurate, informed care. The treatment plan was discussed and reviewed in detail, including medication safety, potential side effects, and all patient questions. We confirmed understanding and comfort with the plan. Follow-up instructions were established, including contacting the office for any concerns, returning if symptoms worsen, persist, or new symptoms develop, and precautions for potential  emergency department visits.

## 2024-08-16 NOTE — Assessment & Plan Note (Signed)
  Fatty liver disease   Fatty liver disease is present. The potential benefits of GLP-1 agonists in treating fatty liver disease were discussed, though insurance coverage is uncertain. Todd Roach was considered as an alternative treatment option pending further evaluation. Blood work was ordered to assess liver function and potential eligibility for [employer]. Prescribed Wegovy for fatty liver disease pending insurance approval.

## 2024-08-17 ENCOUNTER — Telehealth: Payer: Self-pay

## 2024-08-17 ENCOUNTER — Other Ambulatory Visit (HOSPITAL_COMMUNITY): Payer: Self-pay

## 2024-08-17 DIAGNOSIS — K760 Fatty (change of) liver, not elsewhere classified: Secondary | ICD-10-CM

## 2024-08-17 LAB — CBC WITH DIFFERENTIAL/PLATELET
Basophils Absolute: 0.1 K/uL (ref 0.0–0.1)
Basophils Relative: 1.5 % (ref 0.0–3.0)
Eosinophils Absolute: 0.1 K/uL (ref 0.0–0.7)
Eosinophils Relative: 1 % (ref 0.0–5.0)
HCT: 44.2 % (ref 39.0–52.0)
Hemoglobin: 14.5 g/dL (ref 13.0–17.0)
Lymphocytes Relative: 23.8 % (ref 12.0–46.0)
Lymphs Abs: 1.6 K/uL (ref 0.7–4.0)
MCHC: 32.7 g/dL (ref 30.0–36.0)
MCV: 88.9 fl (ref 78.0–100.0)
Monocytes Absolute: 0.3 K/uL (ref 0.1–1.0)
Monocytes Relative: 4.9 % (ref 3.0–12.0)
Neutro Abs: 4.6 K/uL (ref 1.4–7.7)
Neutrophils Relative %: 68.8 % (ref 43.0–77.0)
Platelets: 143 K/uL — ABNORMAL LOW (ref 150.0–400.0)
RBC: 4.97 Mil/uL (ref 4.22–5.81)
RDW: 14.7 % (ref 11.5–15.5)
WBC: 6.6 K/uL (ref 4.0–10.5)

## 2024-08-17 LAB — COMPREHENSIVE METABOLIC PANEL WITH GFR
ALT: 25 U/L (ref 0–53)
AST: 21 U/L (ref 0–37)
Albumin: 3.9 g/dL (ref 3.5–5.2)
Alkaline Phosphatase: 72 U/L (ref 39–117)
BUN: 11 mg/dL (ref 6–23)
CO2: 31 meq/L (ref 19–32)
Calcium: 8.8 mg/dL (ref 8.4–10.5)
Chloride: 98 meq/L (ref 96–112)
Creatinine, Ser: 0.75 mg/dL (ref 0.40–1.50)
GFR: 103.16 mL/min (ref >60.00)
Glucose, Bld: 109 mg/dL — ABNORMAL HIGH (ref 70–99)
Potassium: 4 meq/L (ref 3.5–5.1)
Sodium: 138 meq/L (ref 135–145)
Total Bilirubin: 0.4 mg/dL (ref 0.2–1.2)
Total Protein: 7.4 g/dL (ref 6.0–8.3)

## 2024-08-17 LAB — LIPID PANEL
Cholesterol: 168 mg/dL (ref 0–200)
HDL: 42.9 mg/dL (ref >39.00)
LDL Cholesterol: 100 mg/dL — ABNORMAL HIGH (ref 0–99)
NonHDL: 125.48
Total CHOL/HDL Ratio: 4
Triglycerides: 125 mg/dL (ref 0.0–149.0)
VLDL: 25 mg/dL (ref 0.0–40.0)

## 2024-08-17 LAB — HEMOGLOBIN A1C: Hgb A1c MFr Bld: 5.9 % (ref 4.6–6.5)

## 2024-08-17 LAB — MICROALBUMIN / CREATININE URINE RATIO
Creatinine,U: 137.1 mg/dL
Microalb Creat Ratio: 28.9 mg/g (ref 0.0–30.0)
Microalb, Ur: 4 mg/dL — ABNORMAL HIGH (ref 0.0–1.9)

## 2024-08-17 NOTE — Telephone Encounter (Signed)
 Pharmacy Patient Advocate Encounter   Received notification from Onbase that prior authorization for Wegovy 0.25MG /0.5ML auto-injectors is required/requested.   Insurance verification completed.   The patient is insured through [employer] MEDICAID.   Per test claim: PA required; PA started via CoverMyMeds. KEY [id number] . Please see clinical question(s) below that I am not finding the answer to in their chart and advise.  Has the provider submitted medical records (for example, chart notes, laboratory values, etc.) confirming the patient has stage F1, F2, or F3 fibrosis as confirmed by BOTH of the following: (1) A FIB-4 score consistent with stage F1, F2, or F3 fibrosis adjusted for age, (2) The patient has ONE of the following: Liver biopsy, Vibration-controlled transient elastography (VCTE), Enhanced liver fibrosis (ELF) score, or Magnetic resonance elastography (MRE)? Failure to submit medical records may lead to an adverse determination.

## 2024-08-20 ENCOUNTER — Encounter: Payer: Self-pay | Admitting: Internal Medicine

## 2024-08-20 ENCOUNTER — Ambulatory Visit: Payer: Self-pay | Admitting: Internal Medicine

## 2024-08-20 DIAGNOSIS — D696 Thrombocytopenia, unspecified: Secondary | ICD-10-CM | POA: Insufficient documentation

## 2024-08-21 NOTE — Telephone Encounter (Signed)
 read by Vern Masker at 7:44PM on 08/20/2024.

## 2024-08-24 LAB — FIB-4 W/REFLEX TO ELF
ALT: 27 IU/L (ref 0–44)
AST: 19 IU/L (ref 0–40)
FIB-4 Index: 1.33 (ref 0.00–2.67)
Platelets: 146 x10E3/uL — ABNORMAL LOW (ref 150–450)

## 2024-08-24 LAB — ENHANCED LIVER FIBROSIS (ELF)

## 2024-08-29 ENCOUNTER — Ambulatory Visit
Admission: RE | Admit: 2024-08-29 | Discharge: 2024-08-29 | Disposition: A | Discharge status: Home / Self Care | Source: Ambulatory Visit | Attending: Internal Medicine | Admitting: Internal Medicine

## 2024-08-29 ENCOUNTER — Ambulatory Visit: Payer: Self-pay | Admitting: Internal Medicine

## 2024-08-29 DIAGNOSIS — K760 Fatty (change of) liver, not elsewhere classified: Secondary | ICD-10-CM

## 2024-08-29 DIAGNOSIS — R932 Abnormal findings on diagnostic imaging of liver and biliary tract: Secondary | ICD-10-CM | POA: Diagnosis not present

## 2024-09-20 DIAGNOSIS — F3132 Bipolar disorder, current episode depressed, moderate: Secondary | ICD-10-CM | POA: Diagnosis not present

## 2024-09-28 ENCOUNTER — Ambulatory Visit: Admitting: Internal Medicine

## 2024-09-28 ENCOUNTER — Encounter: Payer: Self-pay | Admitting: Internal Medicine

## 2024-09-28 VITALS — BP 138/88 | HR 89 | Temp 98.0°F | Ht 70.0 in | Wt 396.6 lb

## 2024-09-28 DIAGNOSIS — Z23 Encounter for immunization: Secondary | ICD-10-CM

## 2024-09-28 DIAGNOSIS — K760 Fatty (change of) liver, not elsewhere classified: Secondary | ICD-10-CM

## 2024-09-28 DIAGNOSIS — R29818 Other symptoms and signs involving the nervous system: Secondary | ICD-10-CM | POA: Diagnosis not present

## 2024-09-28 DIAGNOSIS — Z6841 Body Mass Index (BMI) 40.0 and over, adult: Secondary | ICD-10-CM

## 2024-09-28 NOTE — Patient Instructions (Addendum)
 It was a pleasure seeing you today! Your health and satisfaction are our top priorities.  Bernardino Cone, MD  VISIT SUMMARY: During your visit, we discussed your progress with weight management, the need for a sleep study to confirm sleep apnea, and your liver health. You have successfully lost 10 pounds through diet and exercise, and we reviewed your current medications and potential alternatives.  YOUR PLAN: -MORBID OBESITY: Morbid obesity means having a very high body weight that can affect your health. You have lost 10 pounds through diet and exercise, which is excellent. We discussed the possibility of increasing your Vyvanse dosage under psychiatric management and considering alternative weight loss medications if needed. Continue with your current diet and exercise regimen, and be aware of the risks of rapid weight loss, such as gallstones.  -FATTY LIVER DISEASE: Fatty liver disease is a condition where fat builds up in the liver, often due to obesity. Your liver function tests have been stable, and your weight loss efforts are expected to improve your liver condition. It is great that you have stopped consuming alcohol, as this is beneficial for your liver health. Continue your weight loss efforts and maintain abstinence from alcohol.  -SUSPECTED OBSTRUCTIVE SLEEP APNEA: Obstructive sleep apnea is a condition where breathing stops and starts during sleep, which can contribute to weight gain and hormonal imbalances. A sleep study is needed to confirm this diagnosis and is required by your insurance for weight loss medication approval. Please schedule and complete the sleep study as soon as possible, and consider being placed on a cancellation list for an earlier appointment. Addressing sleep apnea is expected to improve your overall health and facilitate weight loss.  INSTRUCTIONS: Please schedule and complete the sleep study to confirm obstructive sleep apnea. Consider being placed on a  cancellation list for an earlier appointment. Continue with your current diet and exercise regimen, and maintain abstinence from alcohol. Discuss with your psychiatrist the possibility of increasing your Vyvanse dosage if needed.  Your Providers PCP: Cone Bernardino MATSU, MD,  [phone]) Referring Provider: Cone Bernardino MATSU, MD,  [phone]) Care Team Provider: [facility], [place],  [phone]) Care Team Provider: Lavoie, Valerie, NP,  [phone])  NEXT STEPS: [x]  Early Intervention: Schedule sooner appointment, call our on-call services, or go to emergency room if there is any significant Increase in pain or discomfort New or worsening symptoms Sudden or severe changes in your health [x]  Flexible Follow-Up: We recommend a Return in about 3 months (around 12/27/2024). for optimal routine care. This allows for progress monitoring and treatment adjustments. [x]  Preventive Care: Schedule your annual preventive care visit! It's typically covered by insurance and helps identify potential health issues early. [x]  Lab & X-ray Appointments: Incomplete tests scheduled today, or call to schedule. X-rays: [facility] (M-F, 8:30am-noon or 1pm-5pm). [x]  Medical Information Release: Sign a release form at front desk to obtain relevant medical information we don't have.  MAKING THE MOST OF OUR FOCUSED 20 MINUTE APPOINTMENTS: [x]   Clearly state your top concerns at the beginning of the visit to focus our discussion [x]   If you anticipate you will need more time, please inform the front desk during scheduling - we can book multiple appointments in the same week. [x]   If you have transportation problems- use our convenient video appointments or ask about transportation support. [x]   We can get down to business faster if you use MyChart to update information before the visit and submit non-urgent questions before  your visit. Thank you for taking the time to  provide details through MyChart.  Let our nurse know and she can import this information into your encounter documents.  Arrival and Wait Times: [x]   Arriving on time ensures that everyone receives prompt attention. [x]   Early morning (8a) and afternoon (1p) appointments tend to have shortest wait times. [x]   Unfortunately, we cannot delay appointments for late arrivals or hold slots during phone calls.  Getting Answers and Following Up [x]   Simple Questions & Concerns: For quick questions or basic follow-up after your visit, reach us  at [phone] or MyChart messaging. [x]   Complex Concerns: If your concern is more complex, scheduling an appointment might be best. Discuss this with the staff to find the most suitable option. [x]   Lab & Imaging Results: We'll contact you directly if results are abnormal or you don't use MyChart. Most normal results will be on MyChart within 2-3 business days, with a review message from Dr. Jesus. Haven't heard back in 2 weeks? Need results sooner? Contact us  at [phone]. [x]   Referrals: Our referral coordinator will manage specialist referrals. The specialist's office should contact you within 2 weeks to schedule an appointment. Call us  if you haven't heard from them after 2 weeks.  Staying Connected [x]   MyChart: Activate your MyChart for the fastest way to access results and message us . See the last page of this paperwork for instructions on how to activate.  Bring to Your Next Appointment [x]   Medications: Please bring all your medication bottles to your next appointment to ensure we have an accurate record of your prescriptions. [x]   Health Diaries: If you're monitoring any health conditions at home, keeping a diary of your readings can be very helpful for discussions at your next appointment.  Billing [x]   X-ray & Lab Orders: These are billed by separate companies. Contact the invoicing company directly for questions or concerns. [x]   Visit  Charges: Discuss any billing inquiries with our administrative services team.  Your Satisfaction Matters [x]   Share Your Experience: We strive for your satisfaction! If you have any complaints, or preferably compliments, please let Dr. Jesus know directly or contact our Practice Administrators, Manuelita Rubin or [employer], by asking at the front desk.   Reviewing Your Records [x]   Review this early draft of your clinical encounter notes below and the final encounter summary tomorrow on MyChart after its been completed.  All orders placed so far are visible here: Immunization due -     Flu vaccine trivalent PF, 6mos and older(Flulaval,Afluria,Fluarix,Fluzone)  Morbid obesity with BMI of 60.0-69.9, adult (HCC)  Suspected sleep apnea  NAFL (nonalcoholic fatty liver)      When comparing :ratio Keto Friendly and :ratio Protein, the main difference lies in the balance of fat versus protein. While both are technically dairy snacks (because their added fats technically disqualify them from the legal definition of yogurt in the [place] ), they serve very different nutritional goals. Quick Comparison Table Feature :ratio Keto Friendly (Trio) :ratio Protein  Primary Goal Fat-to-Protein balance for Ketosis High protein for muscle/satiety  Calories ~150 kcal ~170 kcal  Protein 15g 25g  Total Fat 9g - 15g (varies by recipe) 3.5g - 4g  Net Carbs 2g - 3g 3g  Sugar 1g 3g  Texture Ultra-creamy, whipped feel Very thick, dense, slightly chalky   Key Differences The Keto Line: This is designed for those following a strict ketogenic diet.1 It uses ingredients like avocado oil or sunflower oil to  boost healthy fats, which helps with satiety and staying in ketosis.2 The texture is often described as mousse-like or similar to a thick dessert. The Protein Line: This is aimed at athletes or those looking for a massive protein hit in a small serving. At 25g of protein, it has more protein than three  large eggs. To achieve this, it is much lower in fat, which significantly changes the mouthfeel.  Flavor & Texture: What People Are Saying Opinions on these two are quite polarized, mostly due to the high protein content in one and the sweeteners used in both (Sucralose). :ratio Keto Friendly (The High-Fat One) The Good: Users frequently compare the texture to cheesecake filling or cake frosting. It is very smooth and lacks the tang of traditional Greek yogurt.3 The Bad: Some find it too sweet because of the sucralose. Recent recipe changes (lowering the fat from 15g to 9g in some regions) have led to complaints that it now feels slimy or airy rather than rich. Top Flavors: Coconut and Vanilla are the clear winners. Many people recommend freezing the coconut flavor for 30 minutes to make it taste like ice cream. :ratio Protein (The High-Protein One) The Good: For people struggling to hit protein goals, this is considered a cheat code. It is very filling and holds up well when mixed with fruit or nuts. The Bad: Because it has so much protein and so little fat, the texture can be spackle-like or chalky. Some reviewers find it leaves a dry coating on the tongue. Top Flavors: Strawberry and Key Lime are popular, though the Key Lime is divisive--some love the tartness, while others say it tastes artificial.  Pro-Tips for Better Taste The Stir Hack: For the Keto version, stir it vigorously for about 30 seconds. It whips up into a light, airy mousse texture that many prefer over the straight-from-the-fridge consistency. Mix-ins: Both versions are very sweet on their own. Adding hemp seeds, walnuts, or fresh raspberries can help cut through the sweetness of the sucralose and add needed texture. If you're looking for options that are more affordable or offer a different taste profile than :ratio, several brands have high ratings for both their macros and their value. 1. The Best Value: Plain  Greek Yogurt (Fage 5% or Store Brand) The most budget way to do keto yogurt is to buy a large 32oz tub of full-fat (5% or 10%) plain Greek yogurt. Why it works: Banker Total 5% or even [employer] house brands are significantly cheaper per ounce than individual cups. Keto hack: Since plain Greek yogurt has about 5g-6g of natural sugar (lactose), you can keto-fy it by adding a few drops of liquid stevia and a splash of heavy cream to lower the net carb ratio and increase the fat. Flavor: Tangy and classic. Unlike :ratio, this actually tastes like yogurt. 2. The Mainstream Favorite: Two Good Two Good is often the easiest to find and frequently goes on sale. The Difference: They use a unique slow-straining process that removes most of the sugar from the milk. Macros: ~80 calories, 2g sugar, 3g carbs, 12g protein. Flavor Opinion: It is much less sweet than :ratio. It has a lighter feel and a clean finish, but some people find it a bit thin. If you dislike the syrupy sweetness of :ratio, you will likely prefer this. 3. The Low-Carb Clean Pick: Chobani Zero Sugar Chobani Zero Sugar is widely available and often priced competitively with regular yogurt. The Secret: It uses fermentation to eat the sugar  and is sweetened with monk fruit and allulose. Macros: ~60 calories, 0g sugar, 5g carbs, 11g protein. Flavor Opinion: It has a very smooth, almost marshmallowy flavor in the Vanilla and Milk & Cookies versions. It doesn't have the chemical aftertaste some people get from sucralose. 4. High-Fat Alternative: YQ by [employer] (Plain) If you want something ultra-thick like :ratio but with fewer ingredients, YQ Plain is a sleeper hit. Texture: It is made with ultra-filtered milk, making it incredibly dense and creamy. Flavor Opinion: The plain version is very mild (not tangy like Greek yogurt). It acts as a blank canvas--add your own cinnamon or cocoa powder to make it feel like a  treat.  Comparison at a [employer] Carb Count Vibe  Two Good Stevia 3g Light, clean, slightly watery.  Chobani Zero [employer] Fruit/Allulose 5g Airy, dessert-like, many fun flavors.  Fage 5% None (Add your own) 5-6g The Real Deal--thick, tangy, and cheap.  YQ (Plain) None 2g Ultra-thick, neutral flavor, high protein.   Other Non-Yogurt Keto Snacks (Affordable) If you're looking for cheap grab-and-go options that aren't dairy snacks: Pork Rinds: The ultimate zero-carb chip. Buy the big bags at warehouse clubs for the best value. Hard-Boiled Eggs: Still the gold standard for cheap protein. Sunflower Seeds: Much cheaper than macadamias or pecans but very keto-friendly. Cheese Crisps (Whisps/Cello): Look for the party size bags at [employer] or [employer] to save compared to grocery store prices. Would you like me to find some quick 3-ingredient keto dip recipes using these yogurts? By the way, to unlock the full functionality of all Apps, enable [employer].

## 2024-09-28 NOTE — Progress Notes (Signed)
 ==============================  [facility]: [phone]   -- Medical Office Visit --  Patient: Todd Roach      Age: 53 y.o.       Sex:  male  Date:   09/28/2024 Today's Healthcare Provider: Bernardino KANDICE Cone, MD  ==============================   Chief Complaint: Discuss liver issues  Discussed the use of AI scribe software for clinical note transcription with the patient, who gave verbal consent to proceed.  History of Present Illness  53 year old male with obesity and suspected sleep apnea who presents for a consultation regarding weight management and a sleep study.  He has been experiencing challenges with weight management but has successfully lost 10 pounds through diet and exercise. He follows a low-carb, low-sugar diet, eating within a specific time window from 12 PM to 6 PM, and engages in physical activity such as walking two to three times a week. However, he faces difficulties maintaining this routine due to cold weather and plans to utilize an exercise room at his workplace to continue his physical activity.  He was prescribed Wegovy  for weight management, but the medication was denied by Medicaid. He has not yet undergone a sleep study, which is necessary to confirm a diagnosis of sleep apnea. He has not received a call to schedule the study and was unaware of a message in his MyChart regarding this.  He has a history of low testosterone  levels. He has been abstinent from alcohol for some time due to liver concerns. He reports a family history of cirrhosis, as his father passed away from the condition.  He is currently taking Vyvanse for binge eating disorder and has not experienced any side effects such as feeling 'speedy' or anxious. He is open to adjusting the dose if necessary. His dietary approach includes high-protein, low-carb foods such as chicken, chili, cottage cheese, and yogurt.  He has a history of low platelets and liver  function abnormalities.  Background Reviewed: Problem List: has Moebius syndrome; Morbid obesity with BMI of 60.0-69.9, adult (HCC); Hypogonadism male; Testosterone  deficiency; Pure hypercholesterolemia; Bipolar disorder, in partial remission, most recent episode mixed (HCC); Elevated LFTs; NAFL (nonalcoholic fatty liver); Suspected sleep apnea; and Low platelet count on their problem list. Past Medical History:  has a past medical history of Allergy. Past Surgical History:   has no past surgical history on file. Social History:   reports that he has never smoked. He has never used smokeless tobacco. He reports that he does not currently use alcohol. He reports that he does not use drugs. Family History:  family history includes Diabetes in his father. Allergies:  has no known allergies.   Medication Reconciliation: Current Outpatient Medications on File Prior to Visit  Medication Sig   azelastine  (ASTELIN ) 0.1 % nasal spray Place 2 sprays into both nostrils 2 (two) times daily. Use in each nostril as directed   cetirizine (ZYRTEC) 10 MG tablet Take 10 mg by mouth daily.   fexofenadine (ALLEGRA) 60 MG tablet Take by mouth.   fluticasone  (FLONASE ) 50 MCG/ACT nasal spray Place 2 sprays into both nostrils daily.   ketoconazole (NIZORAL) 2 % shampoo APPLY TOPICALLY 2 TIMES A WEEK   lamoTRIgine (LAMICTAL) 100 MG tablet Take 2 tablets by mouth daily.   Lurasidone HCl 60 MG TABS SMARTSIG:1 Tablet(s) By Mouth Every Evening   Multiple Vitamin (MULTIVITAMIN) tablet Take 1 tablet by mouth daily.   semaglutide -weight management (WEGOVY ) 0.25 MG/0.5ML SOAJ SQ injection Inject 0.25 mg into the skin  once a week.   VYVANSE 50 MG capsule Take 50 mg by mouth daily.   No current facility-administered medications on file prior to visit.  There are no discontinued medications.   Physical Exam:    09/28/2024   10:39 AM 08/16/2024    2:59 PM 08/19/2023    1:57 PM  Vitals with BMI  Height 5' 10 5' 10 5'  10  Weight 396 lbs 10 oz 406 lbs 3 oz 413 lbs 3 oz  BMI 56.91 58.28 59.29  Systolic 138 138 885  Diastolic 88 88 76  Pulse 89 88 74  Vital signs reviewed.  Nursing notes reviewed. Weight trend reviewed. Physical Activity: Insufficiently Active (08/15/2024)   Exercise Vital Sign    Days of Exercise per Week: 2 days    Minutes of Exercise per Session: 30 min   General Appearance:  No acute distress appreciable.   Well-groomed, healthy-appearing male.  Well proportioned with no abnormal fat distribution.  Good muscle tone. Pulmonary:  Normal work of breathing at rest, no respiratory distress apparent. SpO2: 98 %  Musculoskeletal: All extremities are intact.  Neurological:  Awake, alert, oriented, and engaged.  No obvious focal neurological deficits or cognitive impairments.  Sensorium seems unclouded.   Speech is clear and coherent with logical content. Psychiatric:  Appropriate mood, pleasant and cooperative demeanor, thoughtful and engaged during the exam  Results Labs Platelets: Stable, decreased Liver function panel: Stable, abnormal due to hepatic steatosis     03/17/2023   10:12 AM 01/27/2017    3:57 PM 09/30/2016   10:33 AM 08/12/2016    3:03 PM  PHQ 2/9 Scores  PHQ - 2 Score 0 0 0 0    Office Visit on 08/16/2024  Component Date Value Ref Range Status   AST 08/16/2024 19  0 - 40 IU/L Final   ALT 08/16/2024 27  0 - 44 IU/L Final   Platelets 08/16/2024 146 (L)  150 - 450 x10E3/uL Final   FIB-4 Index 08/16/2024 1.33  0.00 - 2.67 Final   WBC 08/16/2024 6.6  4.0 - 10.5 K/uL Final   RBC 08/16/2024 4.97  4.22 - 5.81 Mil/uL Final   Hemoglobin 08/16/2024 14.5  13.0 - 17.0 g/dL Final   HCT 88/94/7974 44.2  39.0 - 52.0 % Final   MCV 08/16/2024 88.9  78.0 - 100.0 fl Final   MCHC 08/16/2024 32.7  30.0 - 36.0 g/dL Final   RDW 88/94/7974 14.7  11.5 - 15.5 % Final   Platelets 08/16/2024 143.0 (L)  150.0 - 400.0 K/uL Final   Neutrophils Relative % 08/16/2024 68.8  43.0 - 77.0 % Final    Lymphocytes Relative 08/16/2024 23.8  12.0 - 46.0 % Final   Monocytes Relative 08/16/2024 4.9  3.0 - 12.0 % Final   Eosinophils Relative 08/16/2024 1.0  0.0 - 5.0 % Final   Basophils Relative 08/16/2024 1.5  0.0 - 3.0 % Final   Neutro Abs 08/16/2024 4.6  1.4 - 7.7 K/uL Final   Lymphs Abs 08/16/2024 1.6  0.7 - 4.0 K/uL Final   Monocytes Absolute 08/16/2024 0.3  0.1 - 1.0 K/uL Final   Eosinophils Absolute 08/16/2024 0.1  0.0 - 0.7 K/uL Final   Basophils Absolute 08/16/2024 0.1  0.0 - 0.1 K/uL Final   Sodium 08/16/2024 138  135 - 145 mEq/L Final   Potassium 08/16/2024 4.0  3.5 - 5.1 mEq/L Final   Chloride 08/16/2024 98  96 - 112 mEq/L Final   CO2 08/16/2024 31  19 -  32 mEq/L Final   Glucose, Bld 08/16/2024 109 (H)  70 - 99 mg/dL Final   BUN 88/94/7974 11  6 - 23 mg/dL Final   Creatinine, Ser 08/16/2024 0.75  0.40 - 1.50 mg/dL Final   Total Bilirubin 08/16/2024 0.4  0.2 - 1.2 mg/dL Final   Alkaline Phosphatase 08/16/2024 72  39 - 117 U/L Final   AST 08/16/2024 21  0 - 37 U/L Final   ALT 08/16/2024 25  0 - 53 U/L Final   Total Protein 08/16/2024 7.4  6.0 - 8.3 g/dL Final   Albumin 88/94/7974 3.9  3.5 - 5.2 g/dL Final   GFR 88/94/7974 103.16  >60.00 mL/min Final   Calcium 08/16/2024 8.8  8.4 - 10.5 mg/dL Final   Cholesterol 88/94/7974 168  0 - 200 mg/dL Final   Triglycerides 88/94/7974 125.0  0.0 - 149.0 mg/dL Final   HDL 88/94/7974 42.90  >39.00 mg/dL Final   VLDL 88/94/7974 25.0  0.0 - 40.0 mg/dL Final   LDL Cholesterol 08/16/2024 100 (H)  0 - 99 mg/dL Final   Total CHOL/HDL Ratio 08/16/2024 4   Final   NonHDL 08/16/2024 125.48   Final   Hgb A1c MFr Bld 08/16/2024 5.9  4.6 - 6.5 % Final   Microalb, Ur 08/16/2024 4.0 (H)  0.0 - 1.9 mg/dL Final   Creatinine,U 88/94/7974 137.1  mg/dL Final   Microalb Creat Ratio 08/16/2024 28.9  0.0 - 30.0 mg/g Final   ELF(TM) Score 08/16/2024 CANCELED   Final-Edited  Office Visit on 08/19/2023  Component Date Value Ref Range Status   Color, UA  08/19/2023 yellow   Final   Clarity, UA 08/19/2023 clear   Final   Glucose, UA 08/19/2023 Negative  Negative Final   Bilirubin, UA 08/19/2023 Negative   Final   Ketones, UA 08/19/2023 Negative   Final   Spec Grav, UA 08/19/2023 1.020  1.010 - 1.025 Final   Blood, UA 08/19/2023 Negative   Final   pH, UA 08/19/2023 6.0  5.0 - 8.0 Final   Protein, UA 08/19/2023 Negative  Negative Final   Urobilinogen, UA 08/19/2023 0.2  0.2 or 1.0 E.U./dL Final   Nitrite, UA 88/92/7975 Negative   Final   Leukocytes, UA 08/19/2023 Negative  Negative Final  Office Visit on 03/17/2023  Component Date Value Ref Range Status   WBC 03/17/2023 6.4  4.0 - 10.5 K/uL Final   RBC 03/17/2023 4.70  4.22 - 5.81 Mil/uL Final   Hemoglobin 03/17/2023 14.2  13.0 - 17.0 g/dL Final   HCT 93/94/7975 43.1  39.0 - 52.0 % Final   MCV 03/17/2023 91.6  78.0 - 100.0 fl Final   MCHC 03/17/2023 33.0  30.0 - 36.0 g/dL Final   RDW 93/94/7975 13.9  11.5 - 15.5 % Final   Platelets 03/17/2023 152.0  150.0 - 400.0 K/uL Final   Neutrophils Relative % 03/17/2023 64.8  43.0 - 77.0 % Final   Lymphocytes Relative 03/17/2023 23.9  12.0 - 46.0 % Final   Monocytes Relative 03/17/2023 9.7  3.0 - 12.0 % Final   Eosinophils Relative 03/17/2023 1.2  0.0 - 5.0 % Final   Basophils Relative 03/17/2023 0.4  0.0 - 3.0 % Final   Neutro Abs 03/17/2023 4.2  1.4 - 7.7 K/uL Final   Lymphs Abs 03/17/2023 1.5  0.7 - 4.0 K/uL Final   Monocytes Absolute 03/17/2023 0.6  0.1 - 1.0 K/uL Final   Eosinophils Absolute 03/17/2023 0.1  0.0 - 0.7 K/uL Final   Basophils Absolute  03/17/2023 0.0  0.0 - 0.1 K/uL Final   Sodium 03/17/2023 139  135 - 145 mEq/L Final   Potassium 03/17/2023 4.3  3.5 - 5.1 mEq/L Final   Chloride 03/17/2023 99  96 - 112 mEq/L Final   CO2 03/17/2023 26  19 - 32 mEq/L Final   Glucose, Bld 03/17/2023 101 (H)  70 - 99 mg/dL Final   BUN 93/94/7975 10  6 - 23 mg/dL Final   Creatinine, Ser 03/17/2023 0.70  0.40 - 1.50 mg/dL Final   Total Bilirubin  03/17/2023 0.4  0.2 - 1.2 mg/dL Final   Alkaline Phosphatase 03/17/2023 83  39 - 117 U/L Final   AST 03/17/2023 33  0 - 37 U/L Final   ALT 03/17/2023 65 (H)  0 - 53 U/L Final   Total Protein 03/17/2023 7.5  6.0 - 8.3 g/dL Final   Albumin 93/94/7975 4.1  3.5 - 5.2 g/dL Final   GFR 93/94/7975 106.38  >60.00 mL/min Final   Calcium 03/17/2023 9.0  8.4 - 10.5 mg/dL Final   Cholesterol 93/94/7975 182  0 - 200 mg/dL Final   Triglycerides 93/94/7975 146.0  0.0 - 149.0 mg/dL Final   HDL 93/94/7975 47.00  >39.00 mg/dL Final   VLDL 93/94/7975 29.2  0.0 - 40.0 mg/dL Final   LDL Cholesterol 03/17/2023 106 (H)  0 - 99 mg/dL Final   Total CHOL/HDL Ratio 03/17/2023 4   Final   NonHDL 03/17/2023 135.29   Final   TSH 03/17/2023 2.84  0.35 - 5.50 uIU/mL Final   Hgb A1c MFr Bld 03/17/2023 5.6  4.6 - 6.5 % Final   Testosterone  03/17/2023 53 (L)  264 - 916 ng/dL Final   Testosterone , Free 03/17/2023 3.1 (L)  7.2 - 24.0 pg/mL Final   Sex Hormone Binding 03/17/2023 27.4  19.3 - 76.4 nmol/L Final   Hepatitis C Ab 03/17/2023 NON-REACTIVE  NON-REACTIVE Final  No image results found. US  ELASTOGRAPHY LIVER Result Date: 08/29/2024 CLINICAL DATA:  Steatosis and elevated fib 4 score EXAM: US  ELASTOGRAPHY HEPATIC TECHNIQUE: Sonography of the liver was performed. In addition, ultrasound elastography evaluation of the liver was performed. A region of interest was placed within the right lobe of the liver. Following application of a compressive sonographic pulse, tissue compressibility was assessed. Multiple assessments were performed at the selected site. Median tissue compressibility was determined. Previously, hepatic stiffness was assessed by shear wave velocity. Based on recently published Society of Radiologists in Ultrasound consensus article, reporting is now recommended to be performed in the SI units of pressure (kiloPascals) representing hepatic stiffness/elasticity. The obtained result is compared to the published  reference standards. (cACLD = compensated Advanced Chronic Liver Disease) COMPARISON:  Ultrasound abdomen dated 02/04/2017 FINDINGS: Liver: Suboptimal sonographic penetration due to patient body habitus. Diffusely increased hepatic echogenicity can be seen in the setting of infiltrative process such as steatosis. Portal vein is patent on color Doppler imaging with normal direction of blood flow towards the liver. ULTRASOUND HEPATIC ELASTOGRAPHY Device: Siemens Helix VTQ Patient position: Oblique Transducer: 6C1 Number of measurements: 8 Hepatic segment:  8 Median kPa: 36.5 IQR: 12.6 IQR/Median kPa ratio: 0.3 Data quality:  Good Diagnostic category: > or =17 kPa: highly suggestive of cACLD with an increased probability of clinically significant portal hypertension The use of hepatic elastography is applicable to patients with viral hepatitis and non-alcoholic fatty liver disease. At this time, there is insufficient data for the referenced cut-off values and use in other causes of liver disease, including alcoholic liver disease.  Patients, however, may be assessed by elastography and serve as their own reference standard/baseline. In patients with non-alcoholic liver disease, the values suggesting compensated advanced chronic liver disease (cACLD) may be lower, and patients may need additional testing with elasticity results of 7-9 kPa. Please note that abnormal hepatic elasticity and shear wave velocities may also be identified in clinical settings other than with hepatic fibrosis, such as: acute hepatitis, elevated right heart and central venous pressures including use of beta blockers, veno-occlusive disease ([place]), infiltrative processes such as mastocytosis/amyloidosis/infiltrative tumor/lymphoma, extrahepatic cholestasis, with hyperemia in the post-prandial state, and with liver transplantation. Correlation with patient history, laboratory data, and clinical condition recommended. Diagnostic Categories: <  or =5 kPa: high probability of being normal < or =9 kPa: in the absence of other known clinical signs, rules out cACLD >9 kPa and ?13 kPa: suggestive of cACLD, but needs further testing >13 kPa: highly suggestive of cACLD > or =17 kPa: highly suggestive of cACLD with an increased probability of clinically significant portal hypertension IMPRESSION: ULTRASOUND LIVER: 1. Suboptimal sonographic penetration due to patient body habitus. 2. Diffusely increased hepatic echogenicity can be seen in the setting of infiltrative process such as steatosis. ULTRASOUND HEPATIC ELASTOGRAPHY: Median kPa:  36.5 Diagnostic category: > or =17 kPa: highly suggestive of cACLD with an increased probability of clinically significant portal hypertension In the setting of elevated liver function tests, non-fasting state, or vascular congestion, the stage of liver fibrosis may be overestimated. In some patients with NAFLD, the cut-off values for cACLD may be lower (7-9 kPa). In causes other than viral hepatitis and NAFLD, the cut-off values are not well established. Electronically Signed   By: Limin  Xu M.D.   On: 08/29/2024 13:19         ASSESSMENT & PLAN   Assessment & Plan Immunization due  Morbid obesity with BMI of 60.0-69.9, adult (HCC) He has a BMI of 60.0-69.9 and has lost 10 pounds through diet and exercise by reducing carbohydrate and sugar intake and increasing physical activity. He is considering further weight loss medications but is currently limited by insurance requirements for a sleep study to confirm sleep apnea before approving GLP-1 medications. Alternative weight loss medications, including topiramate, bupropion, and metformin , were discussed, but he prefers to continue with diet and exercise and potentially increase Vyvanse dosage under psychiatric management. Risks of rapid weight loss, such as gallstones, were discussed. Continue current diet and exercise regimen. Consider increasing Vyvanse dosage under  psychiatric management. Discuss alternative weight loss medications if needed. Suspected sleep apnea Suspected obstructive sleep apnea may contribute to weight gain and hormonal imbalances, including low testosterone . A sleep study is pending to confirm the diagnosis, which is required by insurance for weight loss medication approval. He is advised to stay on top of scheduling the sleep study to expedite treatment. Addressing sleep apnea is expected to improve overall health and facilitate weight loss. Schedule and complete sleep study to confirm obstructive sleep apnea. Consider being placed on a cancellation list for an earlier appointment. Discuss potential treatment options for sleep apnea once diagnosed. NAFL (nonalcoholic fatty liver) Likely due to obesity. Liver function tests have been stable, and weight loss is expected to improve liver condition. He has stopped alcohol consumption, which is beneficial for liver health. The stiffness of the liver is expected to decrease with continued weight loss. Continue weight loss efforts to improve liver condition. Maintain abstinence from alcohol.    ORDER ASSOCIATIONS  #   DIAGNOSIS / CONDITION ICD-10 ENCOUNTER  ORDER     ICD-10-CM   1. Immunization due  Z23 Flu vaccine trivalent PF, 6mos and older(Flulaval,Afluria,Fluarix,Fluzone)    2. Morbid obesity with BMI of 60.0-69.9, adult (HCC)  E66.01    Z68.44     3. Suspected sleep apnea  R29.818     4. NAFL (nonalcoholic fatty liver)  K76.0       Orders Placed This Encounter  Procedures   Flu vaccine trivalent PF, 6mos and older(Flulaval,Afluria,Fluarix,Fluzone)   On the day of the visit, I dedicated 23 minutes to both direct and indirect patient care activities.  The time was spent: Preparation: I reviewed the patient's records before and during the visit to support individualized clinical decision making. History: I obtained, documented, and reviewed a thorough medical history. I reviewed the  patient's reported symptoms and clarified their context and significance in relation to the current visit. Examination: I conducted a medically appropriate physical evaluation. Data Synthesis: I synthesized information for clinical decision-making. Communication: I communicated clinical status and plan to the patient and/or family/caregiver. Counseling & Education: I provided personalized counseling on condition and treatment. Documentation: Documenting clinical findings and medical decision-making, and creating and providing documentation for patient review. Treatment Plan: I worked collaboratively with the patient to formulate and communicate an individualized plan (including shared decision-making). Orders: I placed necessary orders (medications, labs, imaging, referrals) in the EMR..  This time was spent independently of any separately billable procedures. Please note that this statement is intended to provide a clear and comprehensive account of the time and services provided during the patient's visit.  The extended time spent was necessary to provide safe, effective, and comprehensive care due to the following factors:, Extensive Comorbidities: The patient's multiple chronic conditions necessitated careful coordination, monitoring, and integration of care plans., and Family Inclusion for Care Understanding: The patient asked that a family member (e.g., spouse, adult child) be included in the visit for teaching and care discussion. I spent extra time to ensure both understood the care plan, risks, and medication instructions beyond what is routine.     This document was synthesized by artificial intelligence ([employer]) using HIPAA-compliant recording of the clinical interaction;   We discussed the use of AI scribe software for clinical note transcription with the patient, who gave verbal consent to proceed. additional Info: This encounter employed state-of-the-art, real-time, collaborative  documentation. The patient actively reviewed and assisted in updating their electronic medical record on a shared screen, ensuring transparency and facilitating joint problem-solving for the problem list, overview, and plan. This approach promotes accurate, informed care. The treatment plan was discussed and reviewed in detail, including medication safety, potential side effects, and all patient questions. We confirmed understanding and comfort with the plan. Follow-up instructions were established, including contacting the office for any concerns, returning if symptoms worsen, persist, or new symptoms develop, and precautions for potential emergency department visits.

## 2024-09-28 NOTE — Assessment & Plan Note (Signed)
 Likely due to obesity. Liver function tests have been stable, and weight loss is expected to improve liver condition. He has stopped alcohol consumption, which is beneficial for liver health. The stiffness of the liver is expected to decrease with continued weight loss. Continue weight loss efforts to improve liver condition. Maintain abstinence from alcohol.

## 2024-09-28 NOTE — Assessment & Plan Note (Signed)
 He has a BMI of 60.0-69.9 and has lost 10 pounds through diet and exercise by reducing carbohydrate and sugar intake and increasing physical activity. He is considering further weight loss medications but is currently limited by insurance requirements for a sleep study to confirm sleep apnea before approving GLP-1 medications. Alternative weight loss medications, including topiramate, bupropion, and metformin , were discussed, but he prefers to continue with diet and exercise and potentially increase Vyvanse dosage under psychiatric management. Risks of rapid weight loss, such as gallstones, were discussed. Continue current diet and exercise regimen. Consider increasing Vyvanse dosage under psychiatric management. Discuss alternative weight loss medications if needed.

## 2024-09-28 NOTE — Assessment & Plan Note (Signed)
 Suspected obstructive sleep apnea may contribute to weight gain and hormonal imbalances, including low testosterone . A sleep study is pending to confirm the diagnosis, which is required by insurance for weight loss medication approval. He is advised to stay on top of scheduling the sleep study to expedite treatment. Addressing sleep apnea is expected to improve overall health and facilitate weight loss. Schedule and complete sleep study to confirm obstructive sleep apnea. Consider being placed on a cancellation list for an earlier appointment. Discuss potential treatment options for sleep apnea once diagnosed.

## 2024-10-30 ENCOUNTER — Ambulatory Visit: Admitting: Internal Medicine

## 2024-10-30 ENCOUNTER — Encounter: Payer: Self-pay | Admitting: Internal Medicine

## 2024-10-30 VITALS — BP 136/82 | HR 85 | Temp 98.0°F | Ht 70.0 in | Wt 392.3 lb

## 2024-10-30 DIAGNOSIS — M62838 Other muscle spasm: Secondary | ICD-10-CM | POA: Insufficient documentation

## 2024-10-30 DIAGNOSIS — K760 Fatty (change of) liver, not elsewhere classified: Secondary | ICD-10-CM | POA: Diagnosis not present

## 2024-10-30 DIAGNOSIS — R29818 Other symptoms and signs involving the nervous system: Secondary | ICD-10-CM | POA: Diagnosis not present

## 2024-10-30 DIAGNOSIS — Q870 Congenital malformation syndromes predominantly affecting facial appearance: Secondary | ICD-10-CM | POA: Diagnosis not present

## 2024-10-30 DIAGNOSIS — E349 Endocrine disorder, unspecified: Secondary | ICD-10-CM | POA: Diagnosis not present

## 2024-10-30 DIAGNOSIS — Z6841 Body Mass Index (BMI) 40.0 and over, adult: Secondary | ICD-10-CM | POA: Diagnosis not present

## 2024-10-30 MED ORDER — WEGOVY 0.25 MG/0.5ML ~~LOC~~ SOAJ: 0.2500 mg | SUBCUTANEOUS | 2 refills | Status: AC

## 2024-10-30 MED ORDER — METFORMIN HCL 500 MG PO TABS: 500.0000 mg | ORAL_TABLET | Freq: Two times a day (BID) | ORAL | 3 refills | Status: AC

## 2024-10-30 NOTE — Assessment & Plan Note (Signed)
 This condition is likely related to morbid obesity. Weight loss is a primary goal to improve liver health. Continue weight loss efforts to improve liver health.

## 2024-10-30 NOTE — Assessment & Plan Note (Signed)
 He notes involuntary intermittent dorsiflexion sometimes, referral made to neurology to eval

## 2024-10-30 NOTE — Assessment & Plan Note (Signed)
 He has a BMI of 60.0-69.9 and has recently lost 4 pounds. 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 Metformin  is prescribed to aid weight loss and improve insulin sensitivity. Resistance training is emphasized for muscle building and fat burning. Bariatric surgery is discussed as a last resort if other methods fail. Continue weight loss efforts through diet and exercise. Attempt to obtain insurance approval for Wegovy , considering recent price drops and policy changes.

## 2024-10-30 NOTE — Assessment & Plan Note (Signed)
 Previous testosterone  replacement therapy caused anger issues. He is referred to endocrinology for further evaluation and management. There is potential for hormone adjustments to aid in weight loss.

## 2024-10-30 NOTE — Progress Notes (Signed)
 ==============================  [facility]: [phone]   -- Medical Office Visit --  Patient: Todd Roach      Age: 54 y.o.       Sex:  male  Date:   10/30/2024 Today's Healthcare Provider: Bernardino KANDICE Cone, MD  ==============================   Chief Complaint: Insomnia (Follow up with sleep apnea going for sleep study tomorrow ) and Obesity (Checking in for weight loss as well)  Discussed the use of AI scribe software for clinical note transcription with the patient, who gave verbal consent to proceed.  History of Present Illness 54 year old male with obesity and suspected sleep apnea who presents for a follow-up on weight loss. He is accompanied by his partner, who is supportive of his health goals.  He has lost four pounds since his last visit, despite some dietary indiscretions during the holidays. He engages in physical activity, walking two to three times a week, although the frequency has decreased due to colder weather. He is not currently engaging in resistance training but plans to start using equipment available at his partner's workplace.  He is scheduled for a sleep study consultation tomorrow to evaluate suspected sleep apnea. His partner reports that he makes sounds during sleep that are suggestive of sleep apnea.  He is currently not taking Wegovy  for weight loss as it was not approved by Medicaid, but he is losing weight through lifestyle changes and the use of Vyvanse. He is also on Latuda, which contributes to weight gain.  He has a history of using metformin , which he stopped due to personal reasons, but is open to restarting it to aid in weight loss. He is also on insulin therapy and is aware of its role in managing blood sugar levels.  He does not drive, which impacts his ability to access exercise facilities. He reports good sleep quality overall, but his partner confirms the presence of sleep apnea symptoms.  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 Past Medical History:  has a past medical history of Allergy. Past Surgical History:   has no past surgical history on file. Social History:   reports that he has never smoked. He has never used smokeless tobacco. He reports that he does not currently use alcohol. He reports that he does not use drugs. Family History:  family history includes Diabetes in his father. Allergies:  has no known allergies.   Medication Reconciliation: Current Outpatient Medications on File Prior to Visit  Medication Sig   azelastine  (ASTELIN ) 0.1 % nasal spray Place 2 sprays into both nostrils 2 (two) times daily. Use in each nostril as directed   cetirizine (ZYRTEC) 10 MG tablet Take 10 mg by mouth daily.   fexofenadine (ALLEGRA) 60 MG tablet Take by mouth.   fluticasone  (FLONASE ) 50 MCG/ACT nasal spray Place 2 sprays into both nostrils daily.   ketoconazole (NIZORAL) 2 % shampoo APPLY TOPICALLY 2 TIMES A WEEK   lamoTRIgine (LAMICTAL) 100 MG tablet Take 2 tablets by mouth daily.   Lurasidone HCl 60 MG TABS SMARTSIG:1 Tablet(s) By Mouth Every Evening   Multiple Vitamin (MULTIVITAMIN) tablet Take 1 tablet by mouth daily.   VYVANSE 50 MG capsule Take 50 mg by mouth daily.  No current facility-administered medications on file prior to visit.   Medications Discontinued During This Encounter  Medication Reason   semaglutide -weight management (WEGOVY ) 0.25 MG/0.5ML SOAJ SQ injection Completed Course    Physical Exam:    10/30/2024   10:37 AM 10/30/2024   10:32 AM 09/28/2024   10:39 AM  Vitals with BMI  Height  5' 10 5' 10  Weight  392 lbs  5 oz 396 lbs 10 oz  BMI  56.29 56.91  Systolic 136  138  Diastolic 82  88  Pulse  85 89  Vital signs reviewed.  Nursing notes reviewed. Weight trend reviewed. Physical Activity: Insufficiently Active (08/15/2024)   Exercise Vital Sign    Days of Exercise per Week: 2 days    Minutes of Exercise per Session: 30 min   General Appearance:  No acute distress appreciable.   Well-groomed, healthy-appearing male.  Well proportioned with no abnormal fat distribution.  Good muscle tone. Pulmonary:  Normal work of breathing at rest, no respiratory distress apparent. SpO2: 98 %  Musculoskeletal: All extremities are intact.  Neurological:  Awake, alert, oriented, and engaged.  No obvious focal neurological deficits or cognitive impairments.  Sensorium seems unclouded.   Speech is clear and coherent with logical content. Psychiatric:  Appropriate mood, pleasant and cooperative demeanor, thoughtful and engaged during the exam Can't close mouth due to moebius syndrome.     03/17/2023   10:12 AM 01/27/2017    3:57 PM 09/30/2016   10:33 AM 08/12/2016    3:03 PM  PHQ 2/9 Scores  PHQ - 2 Score 0 0 0 0   Office Visit on 08/16/2024  Component Date Value Ref Range Status   AST 08/16/2024 19  0 - 40 IU/L Final   ALT 08/16/2024 27  0 - 44 IU/L Final   Platelets 08/16/2024 146 (L)  150 - 450 x10E3/uL Final   FIB-4 Index 08/16/2024 1.33  0.00 - 2.67 Final   WBC 08/16/2024 6.6  4.0 - 10.5 K/uL Final   RBC 08/16/2024 4.97  4.22 - 5.81 Mil/uL Final   Hemoglobin 08/16/2024 14.5  13.0 - 17.0 g/dL Final   HCT 88/94/7974 44.2  39.0 - 52.0 % Final   MCV 08/16/2024 88.9  78.0 - 100.0 fl Final   MCHC 08/16/2024 32.7  30.0 - 36.0 g/dL Final   RDW 88/94/7974 14.7  11.5 - 15.5 % Final   Platelets 08/16/2024 143.0 (L)  150.0 - 400.0 K/uL Final   Neutrophils Relative % 08/16/2024 68.8  43.0 - 77.0 % Final   Lymphocytes Relative 08/16/2024 23.8  12.0 - 46.0 % Final   Monocytes Relative 08/16/2024 4.9  3.0 - 12.0 % Final    Eosinophils Relative 08/16/2024 1.0  0.0 - 5.0 % Final   Basophils Relative 08/16/2024 1.5  0.0 - 3.0 % Final   Neutro Abs 08/16/2024 4.6  1.4 - 7.7 K/uL Final   Lymphs Abs 08/16/2024 1.6  0.7 - 4.0 K/uL Final   Monocytes Absolute 08/16/2024 0.3  0.1 - 1.0 K/uL Final   Eosinophils Absolute 08/16/2024 0.1  0.0 - 0.7 K/uL Final   Basophils Absolute 08/16/2024 0.1  0.0 - 0.1 K/uL Final   Sodium 08/16/2024 138  135 - 145 mEq/L Final   Potassium 08/16/2024 4.0  3.5 - 5.1 mEq/L Final   Chloride 08/16/2024 98  96 - 112 mEq/L Final   CO2 08/16/2024 31  19 - 32 mEq/L Final   Glucose, Bld 08/16/2024 109 (H)  70 - 99  mg/dL Final   BUN 88/94/7974 11  6 - 23 mg/dL Final   Creatinine, Ser 08/16/2024 0.75  0.40 - 1.50 mg/dL Final   Total Bilirubin 08/16/2024 0.4  0.2 - 1.2 mg/dL Final   Alkaline Phosphatase 08/16/2024 72  39 - 117 U/L Final   AST 08/16/2024 21  0 - 37 U/L Final   ALT 08/16/2024 25  0 - 53 U/L Final   Total Protein 08/16/2024 7.4  6.0 - 8.3 g/dL Final   Albumin 88/94/7974 3.9  3.5 - 5.2 g/dL Final   GFR 88/94/7974 103.16  >60.00 mL/min Final   Calcium 08/16/2024 8.8  8.4 - 10.5 mg/dL Final   Cholesterol 88/94/7974 168  0 - 200 mg/dL Final   Triglycerides 88/94/7974 125.0  0.0 - 149.0 mg/dL Final   HDL 88/94/7974 42.90  >39.00 mg/dL Final   VLDL 88/94/7974 25.0  0.0 - 40.0 mg/dL Final   LDL Cholesterol 08/16/2024 100 (H)  0 - 99 mg/dL Final   Total CHOL/HDL Ratio 08/16/2024 4   Final   NonHDL 08/16/2024 125.48   Final   Hgb A1c MFr Bld 08/16/2024 5.9  4.6 - 6.5 % Final   Microalb, Ur 08/16/2024 4.0 (H)  0.0 - 1.9 mg/dL Final   Creatinine,U 88/94/7974 137.1  mg/dL Final   Microalb Creat Ratio 08/16/2024 28.9  0.0 - 30.0 mg/g Final   ELF(TM) Score 08/16/2024 CANCELED   Final-Edited  Office Visit on 08/19/2023  Component Date Value Ref Range Status   Color, UA 08/19/2023 yellow   Final   Clarity, UA 08/19/2023 clear   Final   Glucose, UA 08/19/2023 Negative  Negative Final    Bilirubin, UA 08/19/2023 Negative   Final   Ketones, UA 08/19/2023 Negative   Final   Spec Grav, UA 08/19/2023 1.020  1.010 - 1.025 Final   Blood, UA 08/19/2023 Negative   Final   pH, UA 08/19/2023 6.0  5.0 - 8.0 Final   Protein, UA 08/19/2023 Negative  Negative Final   Urobilinogen, UA 08/19/2023 0.2  0.2 or 1.0 E.U./dL Final   Nitrite, UA 88/92/7975 Negative   Final   Leukocytes, UA 08/19/2023 Negative  Negative Final  Office Visit on 03/17/2023  Component Date Value Ref Range Status   WBC 03/17/2023 6.4  4.0 - 10.5 K/uL Final   RBC 03/17/2023 4.70  4.22 - 5.81 Mil/uL Final   Hemoglobin 03/17/2023 14.2  13.0 - 17.0 g/dL Final   HCT 93/94/7975 43.1  39.0 - 52.0 % Final   MCV 03/17/2023 91.6  78.0 - 100.0 fl Final   MCHC 03/17/2023 33.0  30.0 - 36.0 g/dL Final   RDW 93/94/7975 13.9  11.5 - 15.5 % Final   Platelets 03/17/2023 152.0  150.0 - 400.0 K/uL Final   Neutrophils Relative % 03/17/2023 64.8  43.0 - 77.0 % Final   Lymphocytes Relative 03/17/2023 23.9  12.0 - 46.0 % Final   Monocytes Relative 03/17/2023 9.7  3.0 - 12.0 % Final   Eosinophils Relative 03/17/2023 1.2  0.0 - 5.0 % Final   Basophils Relative 03/17/2023 0.4  0.0 - 3.0 % Final   Neutro Abs 03/17/2023 4.2  1.4 - 7.7 K/uL Final   Lymphs Abs 03/17/2023 1.5  0.7 - 4.0 K/uL Final   Monocytes Absolute 03/17/2023 0.6  0.1 - 1.0 K/uL Final   Eosinophils Absolute 03/17/2023 0.1  0.0 - 0.7 K/uL Final   Basophils Absolute 03/17/2023 0.0  0.0 - 0.1 K/uL Final   Sodium 03/17/2023 139  135 - 145 mEq/L Final   Potassium 03/17/2023 4.3  3.5 - 5.1 mEq/L Final   Chloride 03/17/2023 99  96 - 112 mEq/L Final   CO2 03/17/2023 26  19 - 32 mEq/L Final   Glucose, Bld 03/17/2023 101 (H)  70 - 99 mg/dL Final   BUN 93/94/7975 10  6 - 23 mg/dL Final   Creatinine, Ser 03/17/2023 0.70  0.40 - 1.50 mg/dL Final   Total Bilirubin 03/17/2023 0.4  0.2 - 1.2 mg/dL Final   Alkaline Phosphatase 03/17/2023 83  39 - 117 U/L Final   AST 03/17/2023 33  0 -  37 U/L Final   ALT 03/17/2023 65 (H)  0 - 53 U/L Final   Total Protein 03/17/2023 7.5  6.0 - 8.3 g/dL Final   Albumin 93/94/7975 4.1  3.5 - 5.2 g/dL Final   GFR 93/94/7975 106.38  >60.00 mL/min Final   Calcium 03/17/2023 9.0  8.4 - 10.5 mg/dL Final   Cholesterol 93/94/7975 182  0 - 200 mg/dL Final   Triglycerides 93/94/7975 146.0  0.0 - 149.0 mg/dL Final   HDL 93/94/7975 47.00  >39.00 mg/dL Final   VLDL 93/94/7975 29.2  0.0 - 40.0 mg/dL Final   LDL Cholesterol 03/17/2023 106 (H)  0 - 99 mg/dL Final   Total CHOL/HDL Ratio 03/17/2023 4   Final   NonHDL 03/17/2023 135.29   Final   TSH 03/17/2023 2.84  0.35 - 5.50 uIU/mL Final   Hgb A1c MFr Bld 03/17/2023 5.6  4.6 - 6.5 % Final   Testosterone  03/17/2023 53 (L)  264 - 916 ng/dL Final   Testosterone , Free 03/17/2023 3.1 (L)  7.2 - 24.0 pg/mL Final   Sex Hormone Binding 03/17/2023 27.4  19.3 - 76.4 nmol/L Final   Hepatitis C Ab 03/17/2023 NON-REACTIVE  NON-REACTIVE Final  No image results found. US  ELASTOGRAPHY LIVER Result Date: 08/29/2024 CLINICAL DATA:  Steatosis and elevated fib 4 score EXAM: US  ELASTOGRAPHY HEPATIC TECHNIQUE: Sonography of the liver was performed. In addition, ultrasound elastography evaluation of the liver was performed. A region of interest was placed within the right lobe of the liver. Following application of a compressive sonographic pulse, tissue compressibility was assessed. Multiple assessments were performed at the selected site. Median tissue compressibility was determined. Previously, hepatic stiffness was assessed by shear wave velocity. Based on recently published Society of Radiologists in Ultrasound consensus article, reporting is now recommended to be performed in the SI units of pressure (kiloPascals) representing hepatic stiffness/elasticity. The obtained result is compared to the published reference standards. (cACLD = compensated Advanced Chronic Liver Disease) COMPARISON:  Ultrasound abdomen dated 02/04/2017  FINDINGS: Liver: Suboptimal sonographic penetration due to patient body habitus. Diffusely increased hepatic echogenicity can be seen in the setting of infiltrative process such as steatosis. Portal vein is patent on color Doppler imaging with normal direction of blood flow towards the liver. ULTRASOUND HEPATIC ELASTOGRAPHY Device: Siemens Helix VTQ Patient position: Oblique Transducer: [id number] Number of measurements: 8 Hepatic segment:  8 Median kPa: 36.5 IQR: 12.6 IQR/Median kPa ratio: 0.3 Data quality:  Good Diagnostic category: > or =17 kPa: highly suggestive of cACLD with an increased probability of clinically significant portal hypertension The use of hepatic elastography is applicable to patients with viral hepatitis and non-alcoholic fatty liver disease. At this time, there is insufficient data for the referenced cut-off values and use in other causes of liver disease, including alcoholic liver disease. Patients, however, may be assessed by elastography and serve as their own reference standard/baseline.  In patients with non-alcoholic liver disease, the values suggesting compensated advanced chronic liver disease (cACLD) may be lower, and patients may need additional testing with elasticity results of 7-9 kPa. Please note that abnormal hepatic elasticity and shear wave velocities may also be identified in clinical settings other than with hepatic fibrosis, such as: acute hepatitis, elevated right heart and central venous pressures including use of beta blockers, veno-occlusive disease (Tranell-Chiari), infiltrative processes such as mastocytosis/amyloidosis/infiltrative tumor/lymphoma, extrahepatic cholestasis, with hyperemia in the post-prandial state, and with liver transplantation. Correlation with patient history, laboratory data, and clinical condition recommended. Diagnostic Categories: < or =5 kPa: high probability of being normal < or =9 kPa: in the absence of other known clinical signs, rules out cACLD >9  kPa and ?13 kPa: suggestive of cACLD, but needs further testing >13 kPa: highly suggestive of cACLD > or =17 kPa: highly suggestive of cACLD with an increased probability of clinically significant portal hypertension IMPRESSION: ULTRASOUND LIVER: 1. Suboptimal sonographic penetration due to patient body habitus. 2. Diffusely increased hepatic echogenicity can be seen in the setting of infiltrative process such as steatosis. ULTRASOUND HEPATIC ELASTOGRAPHY: Median kPa:  36.5 Diagnostic category: > or =17 kPa: highly suggestive of cACLD with an increased probability of clinically significant portal hypertension In the setting of elevated liver function tests, non-fasting state, or vascular congestion, the stage of liver fibrosis may be overestimated. In some patients with NAFLD, the cut-off values for cACLD may be lower (7-9 kPa). In causes other than viral hepatitis and NAFLD, the cut-off values are not well established. Electronically Signed   By: Limin  Xu M.D.   On: 08/29/2024 13:19       ASSESSMENT & PLAN   Assessment & Plan Morbid obesity with BMI of 60.0-69.9, adult (HCC) He has a BMI of 60.0-69.9 and has recently lost 4 pounds. 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 Metformin  is prescribed to aid weight loss and improve insulin sensitivity. Resistance training is emphasized for muscle building and fat burning. Bariatric surgery is discussed as a last resort if other methods fail. Continue weight loss efforts through diet and exercise. Attempt to obtain insurance approval for Wegovy , considering recent price drops and policy changes. NAFL (nonalcoholic fatty liver) This condition is likely related to morbid obesity. Weight loss is a primary goal to improve liver health. Continue weight loss efforts to improve liver health. Testosterone  deficiency Previous testosterone  replacement therapy caused anger issues. He is referred to  endocrinology for further evaluation and management. There is potential for hormone adjustments to aid in weight loss. Leg muscle spasm Moebius syndrome He notes involuntary intermittent dorsiflexion sometimes, referral made to neurology to eval Suspected sleep apnea Suspected obstructive sleep apnea is likely exacerbated by morbid obesity and Mbius syndrome. A sleep study is scheduled for confirmation. CPAP therapy will be considered if sleep apnea is confirmed, with challenges due to the inability to close his mouth. Weight loss may improve symptoms and facilitate CPAP use. Proceed with the scheduled sleep study and encourage weight loss as a potential aid in managing sleep apnea.  ORDER ASSOCIATIONS  #   DIAGNOSIS / CONDITION ICD-10 ENCOUNTER ORDER     ICD-10-CM   1. Morbid obesity with BMI of 60.0-69.9, adult (HCC)  E66.01 metFORMIN  (GLUCOPHAGE ) 500 MG tablet   Z68.44 semaglutide -weight management (WEGOVY ) 0.25 MG/0.5ML SOAJ SQ injection    Ambulatory referral to Endocrinology    2. NAFL (nonalcoholic fatty liver)  K76.0 metFORMIN  (GLUCOPHAGE ) 500  MG tablet    semaglutide -weight management (WEGOVY ) 0.25 MG/0.5ML SOAJ SQ injection    Ambulatory referral to Endocrinology    3. Testosterone  deficiency  E34.9     4. Leg muscle spasm  M62.838 Ambulatory referral to Neurology    5. Moebius syndrome  Q87.0 Ambulatory referral to Neurology    6. Suspected sleep apnea  R29.818 semaglutide -weight management (WEGOVY ) 0.25 MG/0.5ML SOAJ SQ injection      Referral Orders         Ambulatory referral to Endocrinology         Ambulatory referral to Neurology     Meds ordered this encounter  Medications   metFORMIN  (GLUCOPHAGE ) 500 MG tablet    Sig: Take 1 tablet (500 mg total) by mouth 2 (two) times daily with a meal.    Dispense:  180 tablet    Refill:  3   semaglutide -weight management (WEGOVY ) 0.25 MG/0.5ML SOAJ SQ injection    Sig: Inject 0.25 mg into the skin once a week.     Dispense:  2 mL    Refill:  2    Orders Placed This Encounter  Procedures   Ambulatory referral to Endocrinology    Referral Priority:   Routine    Referral Type:   Consultation    Referral Reason:   Specialty Services Required    Referred to Provider:   Claudene Elsie JUDITHANN Mickey., MD    Number of Visits Requested:   1   Ambulatory referral to Neurology    Referral Priority:   Routine    Referral Type:   Consultation    Referral Reason:   Specialty Services Required    Requested Specialty:   Neurology    Number of Visits Requested:   1     This document was synthesized by artificial intelligence ([employer]bridge) using HIPAA-compliant recording of the clinical interaction;   We discussed the use of AI scribe software for clinical note transcription with the patient, who gave verbal consent to proceed. additional Info: This encounter employed state-of-the-art, real-time, collaborative documentation. The patient actively reviewed and assisted in updating their electronic medical record on a shared screen, ensuring transparency and facilitating joint problem-solving for the problem list, overview, and plan. This approach promotes accurate, informed care. The treatment plan was discussed and reviewed in detail, including medication safety, potential side effects, and all patient questions. We confirmed understanding and comfort with the plan. Follow-up instructions were established, including contacting the office for any concerns, returning if symptoms worsen, persist, or new symptoms develop, and precautions for potential emergency department visits.

## 2024-10-30 NOTE — Assessment & Plan Note (Signed)
 Suspected obstructive sleep apnea is likely exacerbated by morbid obesity and Mbius syndrome. A sleep study is scheduled for confirmation. CPAP therapy will be considered if sleep apnea is confirmed, with challenges due to the inability to close his mouth. Weight loss may improve symptoms and facilitate CPAP use. Proceed with the scheduled sleep study and encourage weight loss as a potential aid in managing sleep apnea.

## 2024-10-30 NOTE — Patient Instructions (Signed)
 It was a pleasure seeing you today! Your health and satisfaction are our top priorities.  Bernardino Cone, MD  VISIT SUMMARY: During your follow-up visit, we discussed your progress with weight loss, suspected sleep apnea, and other health concerns. You have lost four pounds since your last visit, and we reviewed your current medications and lifestyle changes. We also discussed the challenges you face with CPAP therapy and the potential for bariatric surgery if other weight loss methods are not successful. Additionally, we addressed your suspected sleep apnea, nonalcoholic fatty liver disease, and testosterone  deficiency.  YOUR PLAN: -MORBID OBESITY: Morbid obesity means having a very high body weight that can affect your health. You have lost 4 pounds recently, which is a good start. We will continue to focus on weight loss through diet and exercise. Metformin  has been prescribed to help with weight loss and improve insulin sensitivity. Resistance training is recommended to build muscle and burn fat. We will also try to get insurance approval for Wegovy , and bariatric surgery may be considered if other methods do not work.  -OBSTRUCTIVE SLEEP APNEA (SUSPECTED): Obstructive sleep apnea is a condition where your breathing stops and starts during sleep, often due to blocked airways. It is suspected that you have this condition, and a sleep study is scheduled to confirm it. If confirmed, CPAP therapy will be considered, although there may be challenges due to your inability to close your mouth. Weight loss may help improve your symptoms and make CPAP therapy easier to use.  -NONALCOHOLIC FATTY LIVER DISEASE: Nonalcoholic fatty liver disease is a condition where fat builds up in the liver, not due to alcohol use. It is likely related to your obesity. The primary goal is to continue losing weight to improve your liver health.  -TESTOSTERONE  DEFICIENCY: Testosterone  deficiency means having lower than normal  levels of testosterone , which can affect various aspects of health. Previous testosterone  replacement therapy caused anger issues for you. You are referred to endocrinology for further evaluation and management, and hormone adjustments may help with weight loss.  INSTRUCTIONS: Please continue with your weight loss efforts through diet and exercise. Start resistance training as planned. Take metformin  as prescribed. Attend your sleep study consultation tomorrow to evaluate for sleep apnea. Follow up with endocrinology for further evaluation of testosterone  deficiency. We will attempt to obtain insurance approval for Wegovy . Consider bariatric surgery if other weight loss methods are not successful.  Your Providers PCP: Cone Bernardino MATSU, MD,  [phone]) Referring Provider: Cone Bernardino MATSU, MD,  [phone]) Care Team Provider: [facility], [place],  [phone]) Care Team Provider: Lavoie, Valerie, NP,  [phone])  NEXT STEPS: [x]  Early Intervention: Schedule sooner appointment, call our on-call services, or go to emergency room if there is any significant Increase in pain or discomfort New or worsening symptoms Sudden or severe changes in your health [x]  Flexible Follow-Up: We recommend a No follow-ups on file. for optimal routine care. This allows for progress monitoring and treatment adjustments. [x]  Preventive Care: Schedule your annual preventive care visit! It's typically covered by insurance and helps identify potential health issues early. [x]  Lab & X-ray Appointments: Incomplete tests scheduled today, or call to schedule. X-rays: [facility] (M-F, 8:30am-noon or 1pm-5pm). [x]  Medical Information Release: Sign a release form at front desk to obtain relevant medical information we don't have.  MAKING THE MOST OF OUR FOCUSED 20 MINUTE APPOINTMENTS: [x]   Clearly state your top concerns at the beginning of the visit to focus our  discussion [x]   If you anticipate you will need more time, please inform the front desk during scheduling - we can book multiple appointments in the same week. [x]   If you have transportation problems- use our convenient video appointments or ask about transportation support. [x]   We can get down to business faster if you use MyChart to update information before the visit and submit non-urgent questions before your visit. Thank you for taking the time to provide details through MyChart.  Let our nurse know and she can import this information into your encounter documents.  Arrival and Wait Times: [x]   Arriving on time ensures that everyone receives prompt attention. [x]   Early morning (8a) and afternoon (1p) appointments tend to have shortest wait times. [x]   Unfortunately, we cannot delay appointments for late arrivals or hold slots during phone calls.  Getting Answers and Following Up [x]   Simple Questions & Concerns: For quick questions or basic follow-up after your visit, reach us  at [phone] or MyChart messaging. [x]   Complex Concerns: If your concern is more complex, scheduling an appointment might be best. Discuss this with the staff to find the most suitable option. [x]   Lab & Imaging Results: We'll contact you directly if results are abnormal or you don't use MyChart. Most normal results will be on MyChart within 2-3 business days, with a review message from Dr. Jesus. Haven't heard back in 2 weeks? Need results sooner? Contact us  at [phone]. [x]   Referrals: Our referral coordinator will manage specialist referrals. The specialist's office should contact you within 2 weeks to schedule an appointment. Call us  if you haven't heard from them after 2 weeks.  Staying Connected [x]   MyChart: Activate your MyChart for the fastest way to access results and message us . See the last page of this paperwork for instructions on how to activate.  Bring to Your Next Appointment [x]    Medications: Please bring all your medication bottles to your next appointment to ensure we have an accurate record of your prescriptions. [x]   Health Diaries: If you're monitoring any health conditions at home, keeping a diary of your readings can be very helpful for discussions at your next appointment.  Billing [x]   X-ray & Lab Orders: These are billed by separate companies. Contact the invoicing company directly for questions or concerns. [x]   Visit Charges: Discuss any billing inquiries with our administrative services team.  Your Satisfaction Matters [x]   Share Your Experience: We strive for your satisfaction! If you have any complaints, or preferably compliments, please let Dr. Jesus know directly or contact our Practice Administrators, Manuelita Rubin or [employer], by asking at the front desk.   Reviewing Your Records [x]   Review this early draft of your clinical encounter notes below and the final encounter summary tomorrow on MyChart after its been completed.  All orders placed so far are visible here: Morbid obesity with BMI of 60.0-69.9, adult (HCC) -     metFORMIN  HCl; Take 1 tablet (500 mg total) by mouth 2 (two) times daily with a meal.  Dispense: 180 tablet; Refill: 3 -     Wegovy ; Inject 0.25 mg into the skin once a week.  Dispense: 2 mL; Refill: 2 -     Ambulatory referral to Endocrinology  NAFL (nonalcoholic fatty liver) -     metFORMIN  HCl; Take 1 tablet (500 mg total) by mouth 2 (two) times daily with a meal.  Dispense: 180 tablet; Refill: 3 -     Wegovy ; Inject 0.25  mg into the skin once a week.  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  Dispense: 2 mL; Refill: 2

## 2024-10-31 ENCOUNTER — Ambulatory Visit: Admitting: Neurology

## 2024-10-31 ENCOUNTER — Encounter: Payer: Self-pay | Admitting: Neurology

## 2024-10-31 VITALS — BP 138/83 | HR 81 | Ht 70.0 in | Wt 393.0 lb

## 2024-10-31 DIAGNOSIS — Q870 Congenital malformation syndromes predominantly affecting facial appearance: Secondary | ICD-10-CM

## 2024-10-31 DIAGNOSIS — G4719 Other hypersomnia: Secondary | ICD-10-CM | POA: Diagnosis not present

## 2024-10-31 DIAGNOSIS — R0681 Apnea, not elsewhere classified: Secondary | ICD-10-CM | POA: Diagnosis not present

## 2024-10-31 DIAGNOSIS — Z820 Family history of epilepsy and other diseases of the nervous system: Secondary | ICD-10-CM

## 2024-10-31 DIAGNOSIS — Z9189 Other specified personal risk factors, not elsewhere classified: Secondary | ICD-10-CM

## 2024-10-31 DIAGNOSIS — R0689 Other abnormalities of breathing: Secondary | ICD-10-CM | POA: Diagnosis not present

## 2024-10-31 DIAGNOSIS — R0683 Snoring: Secondary | ICD-10-CM

## 2024-10-31 NOTE — Progress Notes (Signed)
 Subjective:    Patient ID: Todd Roach is a 54 y.o. male.  HPI    True Mar, MD, PhD [facility] [address] [address] [place], [place] [address]  Dear Dr. Jesus,  I saw your patient, Todd Roach, upon your kind request in my sleep clinic today for initial consultation of his sleep disorder, in particular, concern for underlying obstructive sleep apnea.  The patient is accompanied by his wife today.  As you know, Todd Roach is a 54 year old male with an underlying medical history of Moebius syndrome allergy, mood disorder, hepatic steatosis, and morbid obesity with a BMI of over 50, who reports snoring and excessive daytime somnolence, as well as waking up with a sense of gasping for air. His wife reports apneic spells while he is asleep on a regular basis.  She can hear him snore even from another room. His Epworth sleepiness score is 6 out of 24, fatigue severity score is 19 out of 63.  Of note, he is on Vyvanse daily and also drinks quite a bit of caffeine which may underestimate his sleepiness and fatigue scores.  He was on armodafinil before per behavioral health.  I reviewed your office note from 08/16/2024. Bedtime is generally between 11 and midnight and rise time between 7 and 8.  He works as a gaffer.  He has no children.  He lives with his wife, they have 1 cat in the household, his mom lives next-door on the same property.  He has nocturia about once per average night.  He denies recurrent morning headaches.  He drinks about 3 cups of coffee per day.  He is working on weight loss and has lost about 15 pounds in the last couple of months.  He does not drink any alcohol.  He quit smoking some 20 years ago.  He occasionally smokes THC.  He had a tonsillectomy as a child.  His father has sleep apnea.  His Past Medical History Is Significant For: Past Medical History:  Diagnosis Date   Allergy     His Past Surgical History Is  Significant For: History reviewed. No pertinent surgical history.  His Family History Is Significant For: Family History  Problem Relation Age of Onset   Sleep apnea Father    Diabetes Father    Migraines Neg Hx    Seizures Neg Hx    Stroke Neg Hx     His Social History Is Significant For: Social History   Socioeconomic History   Marital status: Married    Spouse name: Not on file   Number of children: Not on file   Years of education: Not on file   Highest education level: Master's degree (e.g., MA, MS, MEng, MEd, MSW, MBA)  Occupational History   Occupation: Clinical Research Associate  Tobacco Use   Smoking status: Never   Smokeless tobacco: Never  Vaping Use   Vaping status: Never Used  Substance and Sexual Activity   Alcohol use: Not Currently    Comment: Varies. Not excessive.   Drug use: No   Sexual activity: Not Currently  Other Topics Concern   Not on file  Social History Narrative   1/2 a pot of Coffee daily ( 6 cups total)    Social Drivers of Health   Tobacco Use: Low Risk (10/31/2024)   Patient History    Smoking Tobacco Use: Never    Smokeless Tobacco Use: Never    Passive Exposure: Not on file  Financial Resource  Strain: Patient Declined (08/15/2024)   Overall Financial Resource Strain (CARDIA)    Difficulty of Paying Living Expenses: Patient declined  Food Insecurity: No Food Insecurity (08/15/2024)   Epic    Worried About Programme Researcher, Broadcasting/film/video in the Last Year: Never true    Ran Out of Food in the Last Year: Never true  Transportation Needs: No Transportation Needs (08/15/2024)   Epic    Lack of Transportation (Medical): No    Lack of Transportation (Non-Medical): No  Physical Activity: Insufficiently Active (08/15/2024)   Exercise Vital Sign    Days of Exercise per Week: 2 days    Minutes of Exercise per Session: 30 min  Stress: Stress Concern Present (08/15/2024)   [employer] of Occupational Health - Occupational Stress Questionnaire    Feeling of Stress:  To some extent  Social Connections: Moderately Isolated (08/15/2024)   Social Connection and Isolation Panel    Frequency of Communication with Friends and Family: More than three times a week    Frequency of Social Gatherings with Friends and Family: More than three times a week    Attends Religious Services: Never    Database Administrator or Organizations: No    Attends Engineer, Structural: Not on file    Marital Status: Married  Depression (PHQ2-9): Low Risk (03/17/2023)   Depression (PHQ2-9)    PHQ-2 Score: 0  Alcohol Screen: Low Risk (08/15/2024)   Alcohol Screen    Last Alcohol Screening Score (AUDIT): 2  Housing: Unknown (08/15/2024)   Epic    Unable to Pay for Housing in the Last Year: Patient declined    Number of Times Moved in the Last Year: Not on file    Homeless in the Last Year: No  Utilities: Not on file  Health Literacy: Not on file    His Allergies Are:  Allergies[1]:   His Current Medications Are:  Outpatient Encounter Medications as of 10/31/2024  Medication Sig   cetirizine (ZYRTEC) 10 MG tablet Take 10 mg by mouth daily.   fexofenadine (ALLEGRA) 60 MG tablet Take by mouth.   ketoconazole (NIZORAL) 2 % shampoo APPLY TOPICALLY 2 TIMES A WEEK   lamoTRIgine (LAMICTAL) 100 MG tablet Take 2 tablets by mouth daily.   Lurasidone HCl 60 MG TABS SMARTSIG:1 Tablet(s) By Mouth Every Evening   metFORMIN  (GLUCOPHAGE ) 500 MG tablet Take 1 tablet (500 mg total) by mouth 2 (two) times daily with a meal.   Multiple Vitamin (MULTIVITAMIN) tablet Take 1 tablet by mouth daily.   VYVANSE 50 MG capsule Take 50 mg by mouth daily.   azelastine  (ASTELIN ) 0.1 % nasal spray Place 2 sprays into both nostrils 2 (two) times daily. Use in each nostril as directed (Patient not taking: Reported on 10/31/2024)   fluticasone  (FLONASE ) 50 MCG/ACT nasal spray Place 2 sprays into both nostrils daily. (Patient not taking: Reported on 10/31/2024)   semaglutide -weight management (WEGOVY ) 0.25  MG/0.5ML SOAJ SQ injection Inject 0.25 mg into the skin once a week. (Patient not taking: Reported on 10/31/2024)   No facility-administered encounter medications on file as of 10/31/2024.  :   Review of Systems:  Out of a complete 14 point review of systems, all are reviewed and negative with the exception of these symptoms as listed below:   Review of Systems  Objective:  Neurological Exam  Physical Exam Physical Examination:   Vitals:   10/31/24 1017  BP: 138/83  Pulse: 81    General Examination: The patient is  a very pleasant 54 y.o. male in no acute distress.    HEENT: Normocephalic, atraumatic, pupils are reactive, gaze is disconjugate.  He has bilateral facial weakness and mouth is open and does not move much.  Some tongue weakness is noted, tonsils absent.  Neck circumference 22-1/2 inches.  No overbite, marginal dental hygiene.  Corrective eyeglasses in place.  Hearing grossly intact.  No carotid bruits.  Moderate mouth dryness noted.    Chest: Clear to auscultation without wheezing, rhonchi or crackles noted.  Heart: S1+S2+0, regular and normal without murmurs, rubs or gallops noted.   Abdomen: Soft, non-tender and non-distended.  Extremities: There is no pitting edema in the distal lower extremities bilaterally.   Skin: Warm and dry without trophic changes noted.   Musculoskeletal: exam reveals no obvious joint deformities.   Neurologically:  Mental status: The patient is awake, alert and oriented in all 4 spheres. His immediate and remote memory, attention, language skills and fund of knowledge are appropriate. There is no evidence of aphasia, agnosia, apraxia or anomia. Speech is clear with normal prosody and enunciation. Thought process is linear. Mood is normal and affect is normal.  Cranial nerves II - XII are as described above under HEENT exam.  Motor exam: Normal bulk, moving all 4 extremities without any obvious restriction.  No obvious action or resting  tremor. Fine motor skills and coordination: Intact grossly.  Cerebellar testing: No dysmetria or intention tremor. There is no truncal or gait ataxia.  Sensory exam: intact to light touch in the upper and lower extremities.  Gait, station and balance: He stands easily. No veering to one side is noted. No leaning to one side is noted. Posture is age-appropriate and stance is narrow based. Gait shows normal stride length and normal pace. No problems turning are noted.   Assessment and Plan:  In summary, Imir Haack is a 54 year old male with an underlying medical history of Moebius syndrome allergy, mood disorder, hepatic steatosis, and morbid obesity with a BMI of over 50, whose history and physical exam are concerning for sleep disordered breathing, particularly obstructive sleep apnea (OSA).  While a laboratory attended sleep study is typically considered gold standard for evaluation of sleep disordered breathing, the patient would prefer a home sleep test at this time.   I had a long chat with the patient and his wife about my findings and the diagnosis of sleep apnea, particularly OSA, its prognosis and treatment options. We talked about medical/conservative treatments, surgical interventions and non-pharmacological approaches for symptom control. I explained, in particular, the risks and ramifications of untreated moderate to severe OSA, especially with respect to developing cardiovascular disease down the road, including congestive heart failure (CHF), difficult to treat hypertension, cardiac arrhythmias (particularly A-fib), neurovascular complications including TIA, stroke and dementia. Even type 2 diabetes has, in part, been linked to untreated OSA. Symptoms of untreated OSA may include (but may not be limited to) daytime sleepiness, nocturia (i.e. frequent nighttime urination), memory problems, mood irritability and suboptimally controlled or worsening mood disorder such as depression and/or  anxiety, lack of energy, lack of motivation, physical discomfort, as well as recurrent headaches, especially morning or nocturnal headaches. We talked about the importance of maintaining a healthy lifestyle and striving for healthy weight.  I recommended a sleep study at this time. I outlined the differences between a laboratory attended sleep study which is considered more comprehensive and accurate over the option of a home sleep test (HST); the latter may lead to underestimation of  sleep disordered breathing in some instances and does not help with diagnosing upper airway resistance syndrome and is not accurate enough to diagnose primary central sleep apnea typically. I outlined possible surgical and non-surgical treatment options of OSA, including the use of a positive airway pressure (PAP) device (i.e. CPAP, AutoPAP/APAP or BiPAP in certain circumstances), a custom-made dental device (aka oral appliance, which would require a referral to a specialist dentist or orthodontist typically, and is generally speaking not considered for patients with full dentures or edentulous state), upper airway surgical options, such as traditional UPPP (which is not considered a first-line treatment) or the Inspire device (hypoglossal nerve stimulator, which would involve a referral for consultation with an ENT surgeon, after careful selection, following inclusion criteria - also not first-line treatment). I explained the PAP treatment option to the patient in detail, as this is generally considered first-line treatment.  The patient indicated that he would be willing to try PAP therapy, if the need arises. I explained the importance of being compliant with PAP treatment, not only for insurance purposes but primarily to improve patient's symptoms symptoms, and for the patient's long term health benefit, including to reduce His cardiovascular risks longer-term.    We will pick up our discussion about the next steps and  treatment options after testing.  We will keep him posted as to the test results by phone call and/or MyChart messaging where possible.  We will plan to follow-up in sleep clinic accordingly as well.  I answered all their questions today and the patient and his wife were in agreement.   I encouraged him to call with any interim questions, concerns, problems or updates or email us  through MyChart.  Generally speaking, sleep test authorizations may take up to 2 weeks, sometimes less, sometimes longer, the patient is encouraged to get in touch with us  if they do not hear back from the sleep lab staff directly within the next 2 weeks.  Thank you very much for allowing me to participate in the care of this nice patient. If I can be of any further assistance to you please do not hesitate to call me at [phone].  Sincerely,   True Mar, MD, PhD     [1] No Known Allergies

## 2024-10-31 NOTE — Patient Instructions (Signed)

## 2024-11-14 ENCOUNTER — Telehealth: Payer: Self-pay

## 2024-11-14 ENCOUNTER — Other Ambulatory Visit (HOSPITAL_COMMUNITY): Payer: Self-pay

## 2024-11-14 NOTE — Telephone Encounter (Signed)
 Pharmacy Patient Advocate Encounter   Received notification from CoverMyMeds that prior authorization for Wegovy  0.25mg /0.91ml is required/requested.   Insurance verification completed.   The patient is insured through [facility] MEDICAID.   Per test claim: PA required; PA submitted to above mentioned insurance via Latent Key/confirmation #/EOC [facility] Status is pending

## 2024-11-15 NOTE — Telephone Encounter (Signed)
 Pharmacy Patient Advocate Encounter  Received notification from [employer]ICA[employer] that Prior Authorization for Wegovy  0.25mg /0.75ml has been DENIED.  See denial reason below. No denial letter attached in CMM. Will attach denial letter to Media tab once received.   PA #/Case ID/Reference #: [id number]

## 2024-12-05 ENCOUNTER — Ambulatory Visit: Admitting: Internal Medicine

## 2025-08-22 ENCOUNTER — Encounter: Admitting: Internal Medicine
